# Patient Record
Sex: Female | Born: 1965 | Race: White | Hispanic: No | Marital: Married | Smoking: Never smoker
Health system: Southern US, Community
[De-identification: ages and names within clinical notes are randomized; demographics above are authoritative.]

## PROBLEM LIST (undated history)

## (undated) DIAGNOSIS — Z4509 Encounter for adjustment and management of other cardiac device: Secondary | ICD-10-CM

## (undated) DIAGNOSIS — Z8679 Personal history of other diseases of the circulatory system: Secondary | ICD-10-CM

## (undated) DIAGNOSIS — IMO0001 Reserved for inherently not codable concepts without codable children: Secondary | ICD-10-CM

## (undated) DIAGNOSIS — R002 Palpitations: Secondary | ICD-10-CM

## (undated) DIAGNOSIS — K269 Duodenal ulcer, unspecified as acute or chronic, without hemorrhage or perforation: Secondary | ICD-10-CM

## (undated) DIAGNOSIS — Z9889 Other specified postprocedural states: Secondary | ICD-10-CM

## (undated) DIAGNOSIS — R112 Nausea with vomiting, unspecified: Secondary | ICD-10-CM

## (undated) DIAGNOSIS — I2699 Other pulmonary embolism without acute cor pulmonale: Secondary | ICD-10-CM

## (undated) DIAGNOSIS — Z8489 Family history of other specified conditions: Secondary | ICD-10-CM

## (undated) DIAGNOSIS — I472 Ventricular tachycardia: Secondary | ICD-10-CM

## (undated) DIAGNOSIS — C73 Malignant neoplasm of thyroid gland: Secondary | ICD-10-CM

## (undated) DIAGNOSIS — I4729 Other ventricular tachycardia: Secondary | ICD-10-CM

## (undated) DIAGNOSIS — G459 Transient cerebral ischemic attack, unspecified: Secondary | ICD-10-CM

## (undated) DIAGNOSIS — K219 Gastro-esophageal reflux disease without esophagitis: Secondary | ICD-10-CM

## (undated) DIAGNOSIS — C801 Malignant (primary) neoplasm, unspecified: Secondary | ICD-10-CM

## (undated) DIAGNOSIS — T81718A Complication of other artery following a procedure, not elsewhere classified, initial encounter: Secondary | ICD-10-CM

## (undated) DIAGNOSIS — E039 Hypothyroidism, unspecified: Secondary | ICD-10-CM

## (undated) DIAGNOSIS — Z972 Presence of dental prosthetic device (complete) (partial): Secondary | ICD-10-CM

## (undated) DIAGNOSIS — Z973 Presence of spectacles and contact lenses: Secondary | ICD-10-CM

## (undated) DIAGNOSIS — M1990 Unspecified osteoarthritis, unspecified site: Secondary | ICD-10-CM

## (undated) DIAGNOSIS — R55 Syncope and collapse: Secondary | ICD-10-CM

## (undated) DIAGNOSIS — R51 Headache: Secondary | ICD-10-CM

## (undated) DIAGNOSIS — I4892 Unspecified atrial flutter: Secondary | ICD-10-CM

## (undated) DIAGNOSIS — T753XXA Motion sickness, initial encounter: Secondary | ICD-10-CM

## (undated) HISTORY — PX: LEFT OOPHORECTOMY: SHX1961

## (undated) HISTORY — PX: LAPAROSCOPY: SHX197

## (undated) HISTORY — DX: Other pulmonary embolism without acute cor pulmonale: T81.718A

## (undated) HISTORY — DX: Reserved for inherently not codable concepts without codable children: IMO0001

## (undated) HISTORY — DX: Encounter for adjustment and management of other cardiac device: Z45.09

## (undated) HISTORY — DX: Personal history of other diseases of the circulatory system: Z86.79

## (undated) HISTORY — DX: Unspecified atrial flutter: I48.92

## (undated) HISTORY — PX: OTHER SURGICAL HISTORY: SHX169

## (undated) HISTORY — DX: Syncope and collapse: R55

## (undated) HISTORY — PX: ABDOMINAL HYSTERECTOMY: SHX81

## (undated) HISTORY — PX: TONSILLECTOMY: SUR1361

## (undated) HISTORY — PX: DIAGNOSTIC LAPAROSCOPY: SUR761

## (undated) HISTORY — DX: Other ventricular tachycardia: I47.29

## (undated) HISTORY — PX: VULVECTOMY: SHX1086

## (undated) HISTORY — DX: Other pulmonary embolism without acute cor pulmonale: I26.99

## (undated) HISTORY — PX: CARDIAC ELECTROPHYSIOLOGY MAPPING AND ABLATION: SHX1292

## (undated) HISTORY — DX: Malignant (primary) neoplasm, unspecified: C80.1

## (undated) HISTORY — DX: Palpitations: R00.2

## (undated) HISTORY — DX: Malignant neoplasm of thyroid gland: C73

## (undated) HISTORY — DX: Ventricular tachycardia: I47.2

---
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
## 1993-01-10 DIAGNOSIS — I2699 Other pulmonary embolism without acute cor pulmonale: Secondary | ICD-10-CM

## 1993-01-10 HISTORY — DX: Other pulmonary embolism without acute cor pulmonale: I26.99

## 1998-07-13 ENCOUNTER — Inpatient Hospital Stay (HOSPITAL_COMMUNITY): Admission: EM | Admit: 1998-07-13 | Discharge: 1998-07-15 | Payer: Self-pay | Admitting: Emergency Medicine

## 1998-09-06 ENCOUNTER — Inpatient Hospital Stay (HOSPITAL_COMMUNITY): Admission: EM | Admit: 1998-09-06 | Discharge: 1998-09-07 | Payer: Self-pay | Admitting: Emergency Medicine

## 1998-09-08 ENCOUNTER — Ambulatory Visit (HOSPITAL_COMMUNITY): Admission: RE | Admit: 1998-09-08 | Discharge: 1998-09-08 | Payer: Self-pay | Admitting: Internal Medicine

## 1998-10-19 ENCOUNTER — Ambulatory Visit (HOSPITAL_COMMUNITY): Admission: RE | Admit: 1998-10-19 | Discharge: 1998-10-19 | Payer: Self-pay | Admitting: Internal Medicine

## 1998-11-15 ENCOUNTER — Inpatient Hospital Stay (HOSPITAL_COMMUNITY): Admission: EM | Admit: 1998-11-15 | Discharge: 1998-11-16 | Payer: Self-pay | Admitting: Emergency Medicine

## 1998-12-17 ENCOUNTER — Ambulatory Visit (HOSPITAL_COMMUNITY): Admission: RE | Admit: 1998-12-17 | Discharge: 1998-12-17 | Payer: Self-pay | Admitting: Internal Medicine

## 1998-12-21 ENCOUNTER — Ambulatory Visit (HOSPITAL_COMMUNITY): Admission: RE | Admit: 1998-12-21 | Discharge: 1998-12-21 | Payer: Self-pay | Admitting: Internal Medicine

## 2000-10-23 ENCOUNTER — Ambulatory Visit (HOSPITAL_COMMUNITY): Admission: RE | Admit: 2000-10-23 | Discharge: 2000-10-23 | Payer: Self-pay | Admitting: Internal Medicine

## 2003-01-11 DIAGNOSIS — I82409 Acute embolism and thrombosis of unspecified deep veins of unspecified lower extremity: Secondary | ICD-10-CM

## 2003-01-11 HISTORY — DX: Acute embolism and thrombosis of unspecified deep veins of unspecified lower extremity: I82.409

## 2005-03-01 ENCOUNTER — Ambulatory Visit: Payer: Self-pay | Admitting: Unknown Physician Specialty

## 2005-03-04 ENCOUNTER — Ambulatory Visit: Payer: Self-pay | Admitting: Unknown Physician Specialty

## 2006-01-31 ENCOUNTER — Ambulatory Visit: Payer: Self-pay | Admitting: Unknown Physician Specialty

## 2006-02-28 ENCOUNTER — Ambulatory Visit: Payer: Self-pay | Admitting: Internal Medicine

## 2006-05-30 ENCOUNTER — Ambulatory Visit: Payer: Self-pay | Admitting: Internal Medicine

## 2006-06-27 ENCOUNTER — Ambulatory Visit: Payer: Self-pay | Admitting: Internal Medicine

## 2006-08-10 ENCOUNTER — Ambulatory Visit: Payer: Self-pay | Admitting: Unknown Physician Specialty

## 2006-12-13 ENCOUNTER — Ambulatory Visit: Payer: Self-pay

## 2007-06-20 ENCOUNTER — Ambulatory Visit: Payer: Self-pay | Admitting: Unknown Physician Specialty

## 2007-06-21 ENCOUNTER — Ambulatory Visit: Payer: Self-pay | Admitting: Unknown Physician Specialty

## 2007-07-02 ENCOUNTER — Encounter: Payer: Self-pay | Admitting: Internal Medicine

## 2007-10-18 ENCOUNTER — Ambulatory Visit: Payer: Self-pay | Admitting: Internal Medicine

## 2008-06-25 ENCOUNTER — Ambulatory Visit: Payer: Self-pay | Admitting: Unknown Physician Specialty

## 2008-07-16 ENCOUNTER — Ambulatory Visit: Payer: Self-pay | Admitting: Unknown Physician Specialty

## 2008-07-23 ENCOUNTER — Ambulatory Visit: Payer: Self-pay | Admitting: Unknown Physician Specialty

## 2008-09-25 ENCOUNTER — Ambulatory Visit: Payer: Self-pay | Admitting: Internal Medicine

## 2008-12-20 ENCOUNTER — Observation Stay: Payer: Self-pay | Admitting: Internal Medicine

## 2009-01-29 ENCOUNTER — Ambulatory Visit: Payer: Self-pay | Admitting: Neurology

## 2009-02-16 ENCOUNTER — Encounter: Payer: Self-pay | Admitting: Internal Medicine

## 2009-02-17 ENCOUNTER — Encounter: Payer: Self-pay | Admitting: Internal Medicine

## 2009-02-26 ENCOUNTER — Ambulatory Visit: Payer: Self-pay | Admitting: Internal Medicine

## 2009-02-26 DIAGNOSIS — R002 Palpitations: Secondary | ICD-10-CM | POA: Insufficient documentation

## 2009-02-26 DIAGNOSIS — E7800 Pure hypercholesterolemia, unspecified: Secondary | ICD-10-CM | POA: Insufficient documentation

## 2009-02-26 DIAGNOSIS — Q078 Other specified congenital malformations of nervous system: Secondary | ICD-10-CM | POA: Insufficient documentation

## 2009-02-26 DIAGNOSIS — R55 Syncope and collapse: Secondary | ICD-10-CM | POA: Insufficient documentation

## 2009-03-02 ENCOUNTER — Encounter: Payer: Self-pay | Admitting: Internal Medicine

## 2009-07-08 ENCOUNTER — Ambulatory Visit: Payer: Self-pay | Admitting: Unknown Physician Specialty

## 2010-02-09 NOTE — Procedures (Signed)
Summary: Holter and Event  Holter and Event   Imported By: Harlon Flor 03/02/2009 12:58:18  _____________________________________________________________________  External Attachment:    Type:   Image     Comment:   External Document

## 2010-02-09 NOTE — Progress Notes (Signed)
Summary: [facility]   Imported By: Harlon Flor 03/02/2009 14:28:32  _____________________________________________________________________  External Attachment:    Type:   Image     Comment:   External Document

## 2010-02-09 NOTE — Medication Information (Signed)
Summary: Med List  Med List   Imported By: Harlon Flor 03/02/2009 13:06:17  _____________________________________________________________________  External Attachment:    Type:   Image     Comment:   External Document

## 2010-02-09 NOTE — Procedures (Signed)
Summary: Holter and Event  Holter and Event   Imported By: Harlon Flor 03/02/2009 12:57:50  _____________________________________________________________________  External Attachment:    Type:   Image     Comment:   External Document

## 2010-02-09 NOTE — Assessment & Plan Note (Signed)
Summary: rov   Visit Type:  Follow-up Primary Provider:  Bethann Punches, MD  CC:  fainted twice - felt it coming on, no sob, and no edema.  History of Present Illness: Mrs. Brandt is seen at the request of Dr. Hyacinth Meeker because of recurrent syncope.  She she has been seen by me remotely in the past.  syncope was apparently identified associated with nonsustained atrial arrhythmias which was finally clarified by the use of implantable loop recorder. It was elected to treat her with flecainide. There is some question for a while as to whether this was associated with lupus. 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  She has been on flecainide since. She saw Dr. Lysbeth Galas at [facility] a few years ago for the same issue. Sotalol was recommended but then the flecainide issue became clear and she has been maintained on flecainide.  For her arthritis she has been treated with Topamax as well asEnbrel  shots.  In January she had a syncopal episode. She had had a deep massage because of pain. She went to bed. About 2:00 she got up and went to the bathroom. She then was next found by her daughter a few minutes later on the kitchen floor with syncope. Ultimately she ended up at [facility] in the first recorded blood pressure was 104/50. Her evaluation was extensive including an ultrasound that was normal heart is Dopplers that were normal CT and MRI of her head that were normal and EEG which is pending. There were some memory issues associated with the above event and Topamax was discontinued. He also stopped the shots.  She then had another syncopal episode about a week or so ago. This also occurred in the context of getting up and using the bathroom. The symptoms of this episode were quite similar to her previously identified symptoms. I should note that she gets up uses the bathroom a couple of times each night.  She has minimal orthostatic intolerance, no shower  intolerance.  her diet is quite replete of salt as well as fluids   Current Problems (verified): 1)  Hypercholesterolemia Iia  (ICD-272.0) 2)  Palpitations  (ICD-785.1) 3)  Atrial Fibrillation  (ICD-427.31) 4)  Syncope and Collapse  (ICD-780.2)  Current Medications (verified): 1)  Effexor Xr 75 Mg Xr24h-Cap (Venlafaxine Hcl) .Marland Kitchen.. 1 By Mouth Once Daily 2)  Clonazepam 0.5 Mg Tabs (Clonazepam) .Marland Kitchen.. 1 By Mouth Once Daily As Needed 3)  Cavan-Folate Dha 65-1 & 250 Mg Misc (Prenatal-Fefum-L Methylfol-Dha) .Marland Kitchen.. 1 Poqd 4)  Ibuprofen 200 Mg Caps (Ibuprofen) .Marland Kitchen.. 1 Poqd As Needed 5)  Aspirin 81 Mg Tbec (Aspirin) .... Take One Tablet By Mouth Daily 6)  Valtrex 1 Gm Tabs (Valacyclovir Hcl) .Marland Kitchen.. 1 By Mouth Two Times A Day As Needed 7)  Methotrexate 2.5 Mg Tabs (Methotrexate Sodium) .Marland Kitchen.. 1 By Mouth Every Week 8)  Nadolol 40 Mg Tabs (Nadolol) .Marland Kitchen.. 1 By Mouth Once Daily 9)  Flecainide Acetate 50 Mg Tabs (Flecainide Acetate) .... Take One Tablet By Mouth Every 12 Hours 10)  Fish Oil 1000 Mg Caps (Omega-3 Fatty Acids) .Marland Kitchen.. 1 By Mouth Once Daily 11)  Enbrel Sureclick 50 Mg/ml Soln (Etanercept) .... Hold 12)  Vitamins C E 500-400 Mg-Unit Caps (Vitamins C E) .Marland Kitchen.. 1 By Mouth Once Daily 13)  Vitamin D3 1000 Unit Caps (Cholecalciferol) .Marland Kitchen.. 1 By Mouth Weekly 14)  Caltrate 600 1500 Mg Tabs (Calcium Carbonate) .Marland Kitchen.. 1 By Mouth Two  Times A Day  Allergies (verified): 1)  ! Vancomycin 2)  ! Codeine 3)  ! Demerol 4)  ! Augmentin 5)  ! Erythromycin  Past History:  Past Medical History: Last updated: 02/26/2009 Atrial Flutter Syncope hx of hypotension hx of palpitations loop recorder white coat hypertension hx of post pulmonary embolus asthma nonsustained ventricular tachycardia  Past Surgical History: Last updated: 02/26/2009 loop recorder implanted 2000, removed 2002 hysterectomy partial vulvectomy diagnositic laparoscopy tonsillectomy laparoscopy lap  Family History: Last updated:  02/26/2009 Father CABG  Vital Signs:  Patient profile:   45 year old female Height:      64 inches Weight:      160.50 pounds BMI:     27.65 Pulse rate:   59 / minute Pulse rhythm:   regular BP sitting:   110 / 76  (left arm) Cuff size:   regular  Vitals Entered By: Mercer Pod (February 26, 2009 1:19 PM)  Physical Exam  General:  Alert and orientedYoung Caucasian female appearing her stated age in no acute distress. HEENT  normal . Neck veins were flat; carotids brisk and full without bruits. No lymphadenopathy. Back without kyphosis. Lungs clear. Heart sounds regular without murmurs or gallops. PMI nondisplaced. Abdomen soft with active bowel sounds without midline pulsation or hepatomegaly. Femoral pulses and distal pulses intact. Extremities were without clubbing cyanosis or edemaSkin warm and dry. 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 In the second event there were some lightheadedness just prior to getting up off the commode. I wonder whether this might not have been a harbinger of the fall to come. I have advised her that when he is in school last days nocturnal startled and going to the bathroom sitting on the commode for a few minutes prior to getting up might help allow equal abrasion of the dysautonomia.  There is no evidence of recurrent atrial arrhythmia. It is unlikely that this scenario is such that arrhythmia is would occur in the same context and no other times. In the event that she has recurrent syncope in a different context perhaps greater pursuit of an  arrhythmic trigger would be appropriate  Problem # 2:  SYNCOPE AND COLLAPSE (ICD-780.2) See above Her updated medication list for this problem includes:    Aspirin 81 Mg Tbec (Aspirin) .Marland Kitchen... Take one tablet by mouth daily    Nadolol 40 Mg Tabs (Nadolol) .Marland Kitchen... 1 by mouth once daily    Flecainide Acetate 50 Mg Tabs (Flecainide acetate) .Marland Kitchen... Take one tablet by mouth every 12 hours  Problem # 3:  ATRIAL TACHYCARDIA-NONSUSTAINED (ICD-427.0) aas above Her updated medication list for this problem includes:    Aspirin 81 Mg Tbec (Aspirin) .Marland Kitchen... Take one tablet by mouth daily    Nadolol 40 Mg Tabs (Nadolol) .Marland Kitchen... 1 by mouth once daily    Flecainide Acetate 50 Mg Tabs (Flecainide acetate) .Marland Kitchen... Take one tablet by mouth every 12 hours

## 2010-05-25 NOTE — Letter (Signed)
June 27, 2006    G. Saverio Danker, MD  [address]  [place], [place] [address]   RE:  LESBIA, OTTAWAY  MRN:  [id number]  /  DOB:  01/06/1966   Dear Koren Bound:   Ms. Claus comes back and she did not tolerate the Rythmol.  I did a  literature search on Flecainide and drug lupus.  I did not see it.  Flecainide is a class 1B and it may not have the same type of lupus that  the class 1A's did.   In any case, she put herself back on the Flecainide and she noted that  the previous prescription of Flecainide was a different formulation by  tablet size and color.  Because of that, I have taken the liberty of  writing her a prescription for Flecainide 100 mg, to take as Tambocor,  DAW.  I hope that she will tolerate this better and more consistently.   PHYSICAL EXAMINATION:  VITAL SIGNS:  Blood pressure 120/80, pulse 68.  LUNGS:  Clear.  HEART:  Sounds regular.  EXTREMITIES:  Without edema.   FOLLOWUP:  I will plan to see her again in six months' time.   Koren Bound, I hope this letter finds you well.    Sincerely,      Duke Salvia, MD, [facility]  Electronically Signed    SCK/MedQ  DD: 06/27/2006  DT: 06/28/2006  Job #: [id number]

## 2010-05-25 NOTE — Letter (Signed)
May 30, 2006    Danella Penton, MD.  [address]  [place], [place] [address]   RE:  Priscilla Greene, Priscilla Greene  MRN:  [id number]  /  DOB:  Oct 06, 1965   Dear Priscilla Greene:   Thanks very much for asking [place] to see Priscilla Greene.  As you know, she  has a history of neurally-mediated syncope and in the past has been at  least on one occasion triggered by nonsustained atrial arrhythmia.  She  also has nonsustained ventricular arrhythmias.  She had been treated  with Flecainide for some time with recent up titration from 50 to 100  mg.  She got a new formulation today and when she took it she became  _________ and was really very sleepy.  She got to work, had a couple of  cups of coffee.  She then had the urge to defecate, which has been  associated with syncope in the past.  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  She was described as being extremely pale, she was quite  hypotensive, we do not know what her heart rate was.  She then gradually  got better with persistent orthostatic intolerance for some time,  persistent pallor.  She now feels much better.   She had tried to down titrate her Nadolol a few months ago.  She had  noted that with 40 going to 20 that she had increasing palpitations so  she went back up to 40 and had been tolerating that relatively well.   She had seen Lavenia Atlas in the interim, and there had been some  concerned raised about whether she may have drug-induced lupus that she  recalls is attributable to Flecainide.  To confess this admission to  you, I am not familiar with this and I will have to do a literature  search on it.  However, it is easy at this point to change the drug, and  we will plan to put her on Propafenone with Rythmol DAW per formulation  so the main consistency of the formulation of 325 mg daily.   On examination  today, her blood pressure was 152/89, her pulse was 86,  we did not bother doing orthostatic's.  Her lungs were clear, heart  sounds were regular, and the extremities were without edema.   Electrocardiogram was normal.   Priscilla Greene, we will plan to see her again in about a month to assess how she  is doing.   I have asked her to increase her salt intake in the interim as well.   Thanks again.    Sincerely,      Duke Salvia, MD, [facility]  Electronically Signed    SCK/MedQ  DD: 05/30/2006  DT: 05/30/2006  Job #: [id number]   CC:    Milton Ferguson, M.D.

## 2010-05-28 NOTE — Discharge Summary (Signed)
Priscilla Greene. [facility]  Patient:    Priscilla Greene                         MRN: [id number] Adm. Date:  [id number] Disc. Date: 11/16/98 Attending:  Nathen May Dictator:   Lavella Hammock, P.A. CC:         Nathen May, M.D. [facility]             Waldon Reining, M.D., [facility], [place], [place].                           Discharge Summary  PROCEDURES:  None.  HOSPITAL COURSE:  Priscilla Greene is a 45 year old female with a history of nonsustained V-tach and multiple syncopal spells who was seen by Dr. Graciela Husbands on November 09, 1998, and no medication changes.  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  She had a spell of extreme weakness while out shopping and did not have any palpitations with this.  Then on the day of discharge she awoke with tachy palpitations and chest tightness with nausea and mild diaphoresis for approximately 30 minutes.  She went to the emergency room but was pain free at the time of exam. At the time of exam she was in normal sinus rhythm.  She was admitted for further evaluation.  Her enzymes were negative for myocardial infarction and her EKG did not show any ischemic changes.  The next day she felt tired but stated this was  normal after these spells and stated that she was having no other symptoms and o further episodes of presyncope.  She was seen by Dr. Graciela Husbands in the hospital and er medications were adjusted by increasing her nadolol to 40 mg a day to 20 mg a day. She was advised to keep her appointment at [facility] on December 01, 1998, and to follow up with Dr. Graciela Husbands as well.  She was considered  stable for discharge on November 16, 1998.  LABORATORY VALUES:  Sodium 134, potassium 4.1, chloride 106, CO2 27, BUN 17, creatinine 0.7, glucose 106, SGOT 39.  Hemoglobin 14.0, hematocrit 39.8, WBC 4.8, platelets 288.  Serial CK-MB negative for myocardial infarction.  EKG:  Sinus  bradycardia rate 59 beats per minute with a QT of 0.429.  DISCHARGE CONDITION:  Stable.  CONSULTATIONS:  None.  COMPLICATIONS:  None.  DISCHARGE DIAGNOSES: 1. Presyncope with tachy palpitations.  Follow up with Dr. Graciela Husbands at [facility]. 2. History of nonsustained ventricular tachycardia. 3. Status post hysterectomy secondary to endometriosis. 4. History of peptic ulcer disease. 5. Status post pulmonary embolus. 6. History of asthma. 7. History of allergies to IV dye, CODEINE and DEMEROL.  DISCHARGE INSTRUCTIONS:  ACTIVITY:  Her activity level was to be as tolerated.  DIET:  She is to stick to a low fat diet.  FOLLOWUP:  She is to keep her [facility] appointment.  She is to follow up  with Dr. Joanie Coddington as needed.  She is to follow up with Dr. Graciela Husbands and the office will call.  DISCHARGE MEDICATIONS: 1. Nadolol 40 mg q.a.m. 2. Paxil 20 mg q.d. 3. Tambocor 50 mg b.i.d. 4. Flovent and atropine as taken at home. 5. Flonase as taken at home. 6. Verapamil 240 mg q.h.s. 7. Coated aspirin 81 mg q.d. 8. Prilosec p.r.n. DD:  11/16/98 TD:  11/16/98 Job: [id number]

## 2010-05-28 NOTE — Cardiovascular Report (Signed)
[place]. [facility]  Patient:    Priscilla Greene, Priscilla Greene Visit Number: [id number] MRN: [id number]          Service Type: CAT Location: [facility] 2854 [id number] Attending Physician:  Nathen May Dictated by:   Nathen May, M.D., [facility] Proc. Date: 10/23/00 Admit Date:  10/23/2000   CC:         Electrophysiology Laboratory  Cleda Clarks, M.D., [facility]   Cardiac Catheterization  PREOPERATIVE DIAGNOSIS:  Syncope with palpations.  POSTOPERATIVE DIAGNOSIS:  Nonsustained atrial tachycardia and nonsustained ventricular tachycardia; with prior loop implantation.  PROCEDURE:  Explantation of a previously implanted loop recorder.  DESCRIPTION OF PROCEDURE:  Following the obtainment of informed consent, the patient was brought to the electrophysiology laboratory and placed on the fluoroscopic table in supine position.  She was given intravenous ciprofloxacin because of a prior allergy to penicillin and vancomycin.  She developed whelps and itching of her upper extremity with this; Cipro was discontinued.  She was given 50 mg IV Solu-Medrol and 50 mg of Benadryl.  After routine prepping and draping of the left upper chest, the line of the previous incision was opened.  It was parallel to the pectoral fold.  The incision was carried down to the layer of the device pocket with sharp dissection, and the pocket was opened.  The device was explanted.  Hemostasis was obtained in the pocket.  The pocket was closed and irrigated with antibiotic-containing saline solution.  The anterior and posterior walls of the pocket were closed with 2-0 suture.  The wound was then closed in three layers in the normal fashion.  The wound was washed, dried and a Benzoin Steri-Strip dressing was then applied.  Sponge, needle and instrument counts were correct at the end of the procedure (according to the staff). Dictated by:   Nathen May, M.D., [facility] Attending  Physician:  Nathen May DD:  10/23/00 TD:  10/23/00 Job: [id number]

## 2010-05-28 NOTE — Op Note (Signed)
[place]. [facility]  Patient:    Priscilla Greene                         MRN: [id number] Proc. Date: 12/21/98 Adm. Date:  [id number] Attending:  Nathen May CC:         Dr. Cleda Clarks, ________Clinic             Electrophysiology Laboratory             Delsa Grana, R.N., [facility]                           Operative Report  PREOPERATIVE DIAGNOSIS:  Syncope with ventricular tachycardia, nonsustained documented.  POSTOPERATIVE DIAGNOSIS:  Syncope with ventricular tachycardia, nonsustained documented.  PROCEDURE:  Implantable loop recorder insertion.  COMPLICATIONS:  None apparent.  SURGEON:  Nathen May, M.D.  DESCRIPTION OF PROCEDURE:  Following the obtainment of informed consent, the patient was brought to the electrophysiology laboratory.  After routine prep and drape of the upper chest, lidocaine was infiltrated in the prepectoral groove. An incision was made and carried down to the layer of the prepectoral fascia using  electrocautery.  A pocket was formed using electrocautery. Hemostasis was obtained.   Thereafter, the Reveal+ [id number] implantable loop was inserted.  Serial number [id number].  It was secured to the prepectoral fascia.  The wound was closed in  three layers in the normal fashion.  The wound was washed, dried and a benzoin,  Steri-Strip dressing was then applied.  Needle count, sponge count and instrument counts were correct at the end of the procedure according to the nurses. DD:  12/21/98 TD:  12/21/98 Job: [id number]

## 2010-05-28 NOTE — Letter (Signed)
February 28, 2006    Bethann Punches, M.D.  [address]  [place], [place] [address]   RE:  Priscilla, Greene  MRN:  [id number]  /  DOB:  1965-12-26   Dear Priscilla Greene:   It was a pleasure to see Priscilla Greene after a hiatus of about 5 years.  As you recall, she has a history of atrial arrhythmias that were very  symptomatic.  I had seen her, Ronn Melena had seen her, and then  somebody else at [facility] had also seen her and we had elected ultimately to  treat her with medications including nadolol and flecainide.  Somewhere  along the line since I saw her last, her flecainide was up-titrated from  50 mg to 100 mg twice daily and this in combination with her nadolol has  rendered her largely symptom free.  This is true at least from her  palpitation point of view.   She denies chest pain or shortness of breath.   She does have significant fatigue.  She has down-titrated her nadolol on  her own over the last couple months to down from 40 to 20 with some  improvement in her fatigue.  Her sleep hygiene, though, is quite  disrupted.  She sleeps about 3 hours a day, going to bed at 8 p.m.,  getting up 11:30 p.m. or 12 a.m., and then spending the rest of the  night awake.  She has daytime somnolence although now working she is  going full steam all day long.   She has had no recurrent syncope.   She has had a history of ventricular ectopy and nonsustained ventricular  tachycardia which is also quiescent.   Her past medical history is largely as noted previously.  She did have  an implantable loop recorder that has been explanted.  Her hemoglobin  A1c recently was 6.9 but she has worked at getting it down to 5.4.  She  has lost 25 pounds.  Her blood pressure is quiescent.  Her HDL is low in  the mid 20s.  LDL is not known.   Her medications include:  1. Prilosec 20.  2. Tambocor 100 b.i.d.  3. Effexor 75.  4. Ranitidine 75 b.i.d.  5. P.r.n. Klonopin.  6. Nadolol 20.   She is allergic  to CODEINE, DEMEROL, VANCOMYCIN, AMOXICILLIN, CIPRO, and  AUGMENTIN.   Social history:  She is married.  She has two children.  The older is  graduating from high school.  She does not use cigarettes, alcohol or  recreational drugs.   Her family history is very strongly positive for coronary artery  disease, the specifics of which I do not remember.   On examination, she is a middle-aged Caucasian female, appearing her  stated age of 64.  Her blood pressure is 121/76, her pulse is 68, her  weight I did not record.  Her HEENT exam demonstrated no icterus or  xanthomata.  The neck veins were flat.  The carotids were brisk and full  bilaterally without bruits.  Back was without kyphosis or scoliosis.  Lungs were clear.  Heart sounds were regular without murmurs or gallops.  The abdomen was soft.  Extremities were without peripheral edema.  Neurologic exam was grossly normal.   Electrocardiogram was not obtained.   IMPRESSION:  1. Atrial tachycardia.  2. Nonsustained ventricular tachycardia.  3. Flecainide and nadolol therapy for the above.  4. Metabolic syndrome notable for:      a.  Low HDL.      b.     Abdominal obesity.      c.     Hyperglycemia.  5. Panic attacks.   Priscilla Greene, Priscilla Greene is doing very well from an arrhythmia point of view.  I share your concern about the flecainide.  In particular in the context  of her cardiac risk factors as suggested by the metabolic syndrome and  her powerful family history, I think monitoring her for the development  of occult coronary disease becomes important to ensure the safety of the  drug.   Today I have spoken to your nurse and she will arrange for a stress  echocardiogram.  In the event that these windows are inadequate, Myoview  testing would be appropriate to consider.   I would be glad to see her on an annual basis if you think that would  help.   I look forward to talking to you about her.    Sincerely,       Duke Salvia, MD, [facility]  Electronically Signed    SCK/MedQ  DD: 02/28/2006  DT: 02/28/2006  Job #: [id number]

## 2010-06-28 ENCOUNTER — Encounter: Payer: Self-pay | Admitting: Internal Medicine

## 2010-07-28 ENCOUNTER — Ambulatory Visit: Payer: Self-pay | Admitting: Obstetrics and Gynecology

## 2011-08-08 ENCOUNTER — Other Ambulatory Visit: Payer: Self-pay | Admitting: Internal Medicine

## 2011-08-09 ENCOUNTER — Ambulatory Visit: Payer: Self-pay | Admitting: Internal Medicine

## 2011-08-24 ENCOUNTER — Ambulatory Visit: Payer: Self-pay | Admitting: Internal Medicine

## 2012-05-16 ENCOUNTER — Emergency Department: Payer: Self-pay | Admitting: Internal Medicine

## 2012-05-16 LAB — COMPREHENSIVE METABOLIC PANEL
Albumin: 3.7 g/dL (ref 3.4–5.0)
Alkaline Phosphatase: 60 U/L (ref 50–136)
Anion Gap: 8 (ref 7–16)
BUN: 22 mg/dL — ABNORMAL HIGH (ref 7–18)
Bilirubin,Total: 0.9 mg/dL (ref 0.2–1.0)
Calcium, Total: 8.9 mg/dL (ref 8.5–10.1)
Creatinine: 0.48 mg/dL — ABNORMAL LOW (ref 0.60–1.30)
EGFR (African American): >60
EGFR (Non-African Amer.): >60
Osmolality: 279 (ref 275–301)
SGPT (ALT): 30 U/L (ref 12–78)
Sodium: 138 mmol/L (ref 136–145)
Total Protein: 6.9 g/dL (ref 6.4–8.2)

## 2012-05-16 LAB — TSH: Thyroid Stimulating Horm: 1.77 u[IU]/mL

## 2012-05-16 LAB — CK TOTAL AND CKMB (NOT AT ARMC)
CK, Total: 73 U/L (ref 21–215)
CK-MB: <0.5 ng/mL — ABNORMAL LOW (ref 0.5–3.6)

## 2012-08-28 ENCOUNTER — Ambulatory Visit: Payer: Self-pay | Admitting: Internal Medicine

## 2012-09-03 ENCOUNTER — Encounter (INDEPENDENT_AMBULATORY_CARE_PROVIDER_SITE_OTHER): Payer: Self-pay | Admitting: Surgery

## 2012-09-03 ENCOUNTER — Ambulatory Visit (INDEPENDENT_AMBULATORY_CARE_PROVIDER_SITE_OTHER): Payer: BC Managed Care – PPO | Admitting: Surgery

## 2012-09-03 VITALS — BP 114/68 | HR 64 | Temp 98.1°F | Resp 14 | Ht 64.0 in | Wt 153.4 lb

## 2012-09-03 DIAGNOSIS — D440 Neoplasm of uncertain behavior of thyroid gland: Secondary | ICD-10-CM

## 2012-09-03 DIAGNOSIS — D449 Neoplasm of uncertain behavior of unspecified endocrine gland: Secondary | ICD-10-CM

## 2012-09-03 NOTE — Progress Notes (Signed)
General Surgery [facility]  Chief Complaint  Priscilla Greene presents with  . New Evaluation    thyroid nodule with atypia - referral from 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 Priscilla Greene had had a near syncopal episode during a medical procedure while at work. She underwent a CT scan which showed an incidental finding of a calcified thyroid nodule. Subsequent thyroid ultrasound in July 2014 showed 3 small nodules in the right thyroid lobe. The left lobe showed no evidence of abnormality. The dominant nodule on the right measured 1.43 cm and contained calcifications. Subsequent fine-needle aspiration biopsy showed a follicular lesion with mild cytologic atypia including nuclear grooves and limited colloid content. Priscilla Greene also was noted to have bilateral mildly enlarged level III lymph nodes. She is now referred at this time for surgical evaluation for possible resection for definitive diagnosis.  Priscilla Greene has a history of thyroiditis and 2004. She has had no prior head or neck surgery. The only history of thyroid cancer it is questionable and a maternal aunt. There is no family history of other endocrine neoplasm. Priscilla Greene has never been on thyroid hormone. Recent TSH level is normal at 1.04.  Past Medical History  Diagnosis Date  . Atrial flutter   . Syncope   . History of hypotension   . Palpitations     Hx of  . Encounter for interrogation of cardiac recorder     Loop recorder  . White coat hypertension   . Postoperative pulmonary embolism     Hx of  . Asthma   . Nonsustained ventricular tachycardia     Current Outpatient Prescriptions  Medication Sig Dispense Refill  . acetaminophen (TYLENOL) 500 MG tablet Take 500 mg by mouth every 6 (six) hours as needed for pain.      Marland Kitchen aspirin 81 MG EC  tablet Take 81 mg by mouth daily.        . Calcium Carbonate (CALTRATE 600) 1500 MG TABS Take 1 tablet by mouth 2 (two) times daily.        . cetirizine (ZYRTEC) 10 MG tablet Take 10 mg by mouth daily.      . flecainide (TAMBOCOR) 50 MG tablet Take 50 mg by mouth every 12 (twelve) hours.        Marland Kitchen ibuprofen (ADVIL,MOTRIN) 200 MG tablet Take 200 mg by mouth daily.        . nadolol (CORGARD) 40 MG tablet Take 40 mg by mouth daily.        . valACYclovir (VALTREX) 1000 MG tablet Take 1,000 mg by mouth 2 (two) times daily as needed.        . Vitamins C E 500-400 MG-UNIT CAPS Take 1 capsule by mouth daily.        No current facility-administered medications for this visit.    Allergies  Allergen Reactions  . Amoxicillin-Pot Clavulanate   . Codeine   . Erythromycin   . Meperidine Hcl   . Topamax [Topiramate] Other (See Comments)    Syncope with memory loss  . Vancomycin     Family History  Problem Relation Age of Onset  . Heart disease Father   . Diabetes Mother     History   Social History  . Marital Status: Married    Spouse Name: N/A    Number of  Children: N/A  . Years of Education: N/A   Social History Main Topics  . Smoking status: Never Smoker   . Smokeless tobacco: Never Used  . Alcohol Use: No  . Drug Use: No  . Sexual Activity: None   Other Topics Concern  . None   Social History Narrative  . None    REVIEW OF SYSTEMS - PERTINENT POSITIVES ONLY: Denies tremor. Denies palpitations. Occasional mild dysphagia. Occasional discomfort right midneck. Otherwise no compressive symptoms.  EXAM: Filed Vitals:   09/03/12 1316  BP: 114/68  Pulse: 64  Temp: 98.1 F (36.7 C)  Resp: 14    HEENT: normocephalic; pupils equal and reactive; sclerae clear; dentition good; mucous membranes moist NECK:  Mild discomfort on palpation of right thyroid lobe; right lobe is slightly nodular without discrete or dominant mass; no palpable lymphadenopathy; symmetric on extension; no  palpable anterior or posterior cervical lymphadenopathy; no supraclavicular masses; no tenderness CHEST: clear to auscultation bilaterally without rales, rhonchi, or wheezes CARDIAC: regular rate and rhythm without significant murmur; peripheral pulses are full EXT:  non-tender without edema; no deformity NEURO: no gross focal deficits; no sign of tremor   LABORATORY RESULTS: See [facility] (CHL-Epic) for most recent results  RADIOLOGY RESULTS: See [facility] (CHL-Epic) for most recent results  IMPRESSION: Right thyroid nodule, 1.43 cm, with cytologic atypia  PLAN: Priscilla Greene and I discussed the above studies and findings at length. I have recommended right thyroid lobectomy for definitive diagnosis. We will also staple a few regional lymph nodes for evaluation. If the final pathology is benign, then I believe she will require no further intervention. Certainly if there is evidence of malignancy, she would require completion thyroidectomy.  Priscilla Greene and I discussed the procedure at length. We discussed other surgical options. We discussed the risk of the procedure including risk to recurrent laryngeal nerve and parathyroid glands. We discussed the hospital stay to be anticipated. We discussed postoperative recovery and return to activity. She understands and wishes to proceed.  The risks and benefits of the procedure have been discussed at length with the Priscilla Greene.  The Priscilla Greene understands the proposed procedure, potential alternative treatments, and the course of recovery to be expected.  All of the Priscilla Greene's questions have been answered at this time.  The Priscilla Greene wishes to proceed with surgery.  [name], MD, FACS General & Endocrine Surgery [facility]  Primary Care Physician: Danella Penton., MD

## 2012-09-03 NOTE — Patient Instructions (Signed)

## 2012-09-17 ENCOUNTER — Encounter (INDEPENDENT_AMBULATORY_CARE_PROVIDER_SITE_OTHER): Payer: Self-pay

## 2012-10-04 ENCOUNTER — Encounter (HOSPITAL_COMMUNITY): Payer: Self-pay | Admitting: Pharmacy Technician

## 2012-10-10 ENCOUNTER — Ambulatory Visit (HOSPITAL_COMMUNITY)
Admission: RE | Admit: 2012-10-10 | Discharge: 2012-10-10 | Disposition: A | Discharge status: Home / Self Care | Payer: BC Managed Care – PPO | Source: Ambulatory Visit | Attending: Surgery | Admitting: Surgery

## 2012-10-10 ENCOUNTER — Encounter (HOSPITAL_COMMUNITY): Payer: Self-pay

## 2012-10-10 ENCOUNTER — Encounter (HOSPITAL_COMMUNITY)
Admission: RE | Admit: 2012-10-10 | Discharge: 2012-10-10 | Disposition: A | Discharge status: Home / Self Care | Payer: BC Managed Care – PPO | Source: Ambulatory Visit | Attending: Surgery | Admitting: Surgery

## 2012-10-10 DIAGNOSIS — Z01818 Encounter for other preprocedural examination: Secondary | ICD-10-CM | POA: Insufficient documentation

## 2012-10-10 DIAGNOSIS — Z01812 Encounter for preprocedural laboratory examination: Secondary | ICD-10-CM | POA: Insufficient documentation

## 2012-10-10 HISTORY — DX: Other specified postprocedural states: Z98.890

## 2012-10-10 HISTORY — DX: Headache: R51

## 2012-10-10 HISTORY — DX: Gastro-esophageal reflux disease without esophagitis: K21.9

## 2012-10-10 HISTORY — DX: Unspecified osteoarthritis, unspecified site: M19.90

## 2012-10-10 HISTORY — DX: Duodenal ulcer, unspecified as acute or chronic, without hemorrhage or perforation: K26.9

## 2012-10-10 HISTORY — DX: Other specified postprocedural states: R11.2

## 2012-10-10 LAB — BASIC METABOLIC PANEL
BUN: 19 mg/dL (ref 6–23)
CO2: 27 mEq/L (ref 19–32)
Calcium: 9.6 mg/dL (ref 8.4–10.5)
GFR calc non Af Amer: >90 mL/min (ref >90)
Glucose, Bld: 93 mg/dL (ref 70–99)

## 2012-10-10 LAB — CBC
HCT: 45.5 % (ref 36.0–46.0)
Hemoglobin: 15.6 g/dL — ABNORMAL HIGH (ref 12.0–15.0)
MCH: 30.7 pg (ref 26.0–34.0)
MCHC: 34.3 g/dL (ref 30.0–36.0)
MCV: 89.6 fL (ref 78.0–100.0)
Platelets: 251 10*3/uL (ref 150–400)
RBC: 5.08 MIL/uL (ref 3.87–5.11)

## 2012-10-10 NOTE — Progress Notes (Signed)
EKG from [facility] from 05/16/2012 on chart.

## 2012-10-10 NOTE — Patient Instructions (Addendum)
20      Your procedure is scheduled on:  Thursday 10/18/2012  Report to [facility] at  0530 AM.  Call this number if you have problems the night before or morning of surgery:  [phone]   Remember:             IF YOU USE CPAP,BRING MASK AND TUBING AM OF SURGERY!   Do not eat food or drink liquids AFTER MIDNIGHT!  Take these medicines the morning of surgery with A SIP OF WATER: Flecaninide, Nadolol   Do not bring valuables to the hospital. [facility] IS NOT RESPONSIBLE FOR ANY BELONGINGS OR VALUABLES BROUGHT TO HOSPITAL.  Marland Kitchen  Leave suitcase in the car. After surgery it may be brought to your room.  For patients admitted to the hospital, checkout time is 11:00 AM the day of              Discharge.    DO NOT WEAR JEWELRY , MAKE-UP, LOTIONS,POWDERS,PERFUMES!             WOMEN -DO NOT SHAVE LEGS OR UNDERARMS 12 HRS. BEFORE SURGERY!               MEN MAY SHAVE AS USUAL!             CONTACTS,DENTURES OR BRIDGEWORK, FALSE EYELASHES MAY NOT BE WORN INTO SURGERY!                                           Patients discharged the day of surgery will not be allowed to drive home. If going home the same day of surgery, must have someone stay with you first 24 hrs.at home and arrange for someone to drive you home from the Hospital.                        YOUR DRIVER IS:   Special Instructions:             Please read over the following fact sheets that you were given:             1.  PREPARING FOR SURGERY SHEET              2.INCENTIVE SPIROMETRY                                        [place].Priscilla Annas,RN,BSN     [phone]                FAILURE TO FOLLOW THESE INSTRUCTIONS MAY RESULT IN  CANCELLATION OF YOUR SURGERY!               Patient Signature:___________________________

## 2012-10-11 ENCOUNTER — Telehealth (INDEPENDENT_AMBULATORY_CARE_PROVIDER_SITE_OTHER): Payer: Self-pay | Admitting: *Deleted

## 2012-10-11 NOTE — Progress Notes (Signed)
Quick Note:  These results are acceptable for scheduled surgery.  Vicci Reder M. Baily Serpe, MD, FACS [facility] Office: [phone]   ______ 

## 2012-10-11 NOTE — Telephone Encounter (Signed)
Patient called to update Dr. Gerrit Friends that she has been diagnosed with a severe UTI.  Patient started on Septra DS last night per Dr. Derrell Lolling however patient states she has had an allergic reaction to this medication so is going to her PMD today.  She states her PMD has advised her he will be able to give her a shot of Rocephin this afternoon.  Patient just wanted Dr. Gerrit Friends to be aware and is hoping this will not interfere with her upcoming surgery.  Patient also asking if she can take Azo and OTC muscle relaxant for urinary spasms.  Patient just wants to make sure this will not interfere with anything as well.  Explained to patient that a message will be sent to Dr. Gerrit Friends to make him aware and get his opinion and as soon as I hear back from I will update her.  Patient states understanding and agreeable at this time.

## 2012-10-15 ENCOUNTER — Telehealth (INDEPENDENT_AMBULATORY_CARE_PROVIDER_SITE_OTHER): Payer: Self-pay | Admitting: *Deleted

## 2012-10-15 NOTE — Telephone Encounter (Signed)
Message copied by Consuelo Pandy on Mon Oct 15, 2012 10:58 AM ------      Message from: [name]      Created: Mon Oct 15, 2012  9:56 AM       Haig Prophet sent to me:            Patient called to update Dr. Gerrit Friends that she has been diagnosed with a severe UTI.  Patient started on Septra DS last night per Dr. Derrell Lolling however patient states she has had an allergic reaction to this medication so is going to her PMD today.  She states her PMD has advised her he will be able to give her a shot of Rocephin this afternoon.  Patient just wanted Dr. Gerrit Friends to be aware and is hoping this will not interfere with her upcoming surgery.  Patient also asking if she can take Azo and OTC muscle relaxant for urinary spasms.  Patient just wants to make sure this will not interfere with anything as well.  Explained to patient that a message will be sent to Dr. Gerrit Friends to make him aware and get his opinion and as soon as I hear back from I will update her.  Patient states understanding and agreeable at this time.                 I see no problem with any of these meds around the time of this patient's thyroid surgery.            Thanks,            tmg            [name], MD, [facility]      Office: [phone]             ------

## 2012-10-15 NOTE — Telephone Encounter (Signed)
Pt called in stating that her PCP has prescribed her Diflucan 100mg  and nystatin topical cream.  Pt wanted [place] updated on her meds.

## 2012-10-15 NOTE — Telephone Encounter (Signed)
Patient states she is seeing DR. Miller today for bladder spasm.Will keep [place] posted

## 2012-10-15 NOTE — Telephone Encounter (Signed)
This is the response to a previous telephone message on 10/12/12.  LM for patient to call back to update her on Dr. Sid Falcon below message.

## 2012-10-18 ENCOUNTER — Encounter (HOSPITAL_COMMUNITY): Payer: Self-pay | Admitting: *Deleted

## 2012-10-18 ENCOUNTER — Observation Stay (HOSPITAL_COMMUNITY)
Admission: RE | Admit: 2012-10-18 | Discharge: 2012-10-19 | Disposition: A | Discharge status: Home / Self Care | Payer: BC Managed Care – PPO | Source: Ambulatory Visit | Attending: Surgery | Admitting: Surgery

## 2012-10-18 ENCOUNTER — Encounter (HOSPITAL_COMMUNITY): Payer: BC Managed Care – PPO | Admitting: Anesthesiology

## 2012-10-18 ENCOUNTER — Encounter (HOSPITAL_COMMUNITY)
Admission: RE | Disposition: A | Discharge status: Home / Self Care | Payer: Self-pay | Source: Ambulatory Visit | Attending: Surgery

## 2012-10-18 ENCOUNTER — Ambulatory Visit (HOSPITAL_COMMUNITY): Payer: BC Managed Care – PPO | Admitting: Anesthesiology

## 2012-10-18 DIAGNOSIS — M069 Rheumatoid arthritis, unspecified: Secondary | ICD-10-CM | POA: Insufficient documentation

## 2012-10-18 DIAGNOSIS — Z79899 Other long term (current) drug therapy: Secondary | ICD-10-CM | POA: Insufficient documentation

## 2012-10-18 DIAGNOSIS — C73 Malignant neoplasm of thyroid gland: Secondary | ICD-10-CM

## 2012-10-18 DIAGNOSIS — C770 Secondary and unspecified malignant neoplasm of lymph nodes of head, face and neck: Secondary | ICD-10-CM | POA: Insufficient documentation

## 2012-10-18 DIAGNOSIS — Z86711 Personal history of pulmonary embolism: Secondary | ICD-10-CM | POA: Insufficient documentation

## 2012-10-18 DIAGNOSIS — D440 Neoplasm of uncertain behavior of thyroid gland: Secondary | ICD-10-CM

## 2012-10-18 DIAGNOSIS — K219 Gastro-esophageal reflux disease without esophagitis: Secondary | ICD-10-CM | POA: Insufficient documentation

## 2012-10-18 HISTORY — DX: Malignant neoplasm of thyroid gland: C73

## 2012-10-18 HISTORY — PX: THYROID LOBECTOMY: SHX420

## 2012-10-18 SURGERY — LOBECTOMY, THYROID
Anesthesia: General | Site: Neck | Laterality: Bilateral | Wound class: Clean

## 2012-10-18 MED ORDER — HYDROMORPHONE HCL PF 1 MG/ML IJ SOLN: 0.2500 mg | INTRAMUSCULAR | Status: DC | PRN

## 2012-10-18 MED ORDER — GLYCOPYRROLATE 0.2 MG/ML IJ SOLN
INTRAMUSCULAR | Status: DC | PRN
  Administered 2012-10-18: 0.6 mg via INTRAVENOUS

## 2012-10-18 MED ORDER — NEOSTIGMINE METHYLSULFATE 1 MG/ML IJ SOLN
INTRAMUSCULAR | Status: DC | PRN
  Administered 2012-10-18: 5 mg via INTRAVENOUS

## 2012-10-18 MED ORDER — CIPROFLOXACIN IN D5W 400 MG/200ML IV SOLN
400.0000 mg | INTRAVENOUS | Status: AC
  Administered 2012-10-18: 400 mg via INTRAVENOUS

## 2012-10-18 MED ORDER — PROMETHAZINE HCL 25 MG/ML IJ SOLN
12.5000 mg | INTRAMUSCULAR | Status: DC | PRN
Start: 2012-10-18 — End: 2012-10-19
  Filled 2012-10-18: qty 1

## 2012-10-18 MED ORDER — SCOPOLAMINE 1 MG/3DAYS TD PT72
MEDICATED_PATCH | TRANSDERMAL | Status: AC
  Filled 2012-10-18: qty 1

## 2012-10-18 MED ORDER — NITROFURANTOIN MONOHYD MACRO 100 MG PO CAPS
100.0000 mg | ORAL_CAPSULE | Freq: Two times a day (BID) | ORAL | Status: DC
  Administered 2012-10-18 – 2012-10-19 (×3): 100 mg via ORAL
  Filled 2012-10-18 (×4): qty 1

## 2012-10-18 MED ORDER — ROCURONIUM BROMIDE 100 MG/10ML IV SOLN
INTRAVENOUS | Status: DC | PRN
  Administered 2012-10-18: 10 mg via INTRAVENOUS
  Administered 2012-10-18: 40 mg via INTRAVENOUS

## 2012-10-18 MED ORDER — FENTANYL CITRATE 0.05 MG/ML IJ SOLN
INTRAMUSCULAR | Status: DC | PRN
  Administered 2012-10-18: 25 ug via INTRAVENOUS

## 2012-10-18 MED ORDER — KCL IN DEXTROSE-NACL 20-5-0.45 MEQ/L-%-% IV SOLN
INTRAVENOUS | Status: DC
  Administered 2012-10-18 – 2012-10-19 (×2): via INTRAVENOUS
  Filled 2012-10-18 (×3): qty 1000

## 2012-10-18 MED ORDER — METOCLOPRAMIDE HCL 5 MG/ML IJ SOLN
INTRAMUSCULAR | Status: DC | PRN
  Administered 2012-10-18: 10 mg via INTRAVENOUS

## 2012-10-18 MED ORDER — NADOLOL 40 MG PO TABS
40.0000 mg | ORAL_TABLET | Freq: Every morning | ORAL | Status: DC
  Administered 2012-10-19: 40 mg via ORAL
  Filled 2012-10-18: qty 1

## 2012-10-18 MED ORDER — DEXAMETHASONE SODIUM PHOSPHATE 4 MG/ML IJ SOLN
INTRAMUSCULAR | Status: DC | PRN
  Administered 2012-10-18: 10 mg via INTRAVENOUS

## 2012-10-18 MED ORDER — SCOPOLAMINE 1 MG/3DAYS TD PT72
MEDICATED_PATCH | TRANSDERMAL | Status: DC | PRN
  Administered 2012-10-18: 1 via TRANSDERMAL

## 2012-10-18 MED ORDER — LACTATED RINGERS IV SOLN: INTRAVENOUS | Status: DC

## 2012-10-18 MED ORDER — LACTATED RINGERS IV SOLN
INTRAVENOUS | Status: DC | PRN
  Administered 2012-10-18 (×2): via INTRAVENOUS

## 2012-10-18 MED ORDER — HYDROCODONE-ACETAMINOPHEN 5-325 MG PO TABS: 1.0000 | ORAL_TABLET | ORAL | Status: DC | PRN

## 2012-10-18 MED ORDER — MIDAZOLAM HCL 5 MG/5ML IJ SOLN
INTRAMUSCULAR | Status: DC | PRN
  Administered 2012-10-18: 2 mg via INTRAVENOUS

## 2012-10-18 MED ORDER — FLECAINIDE ACETATE 50 MG PO TABS
50.0000 mg | ORAL_TABLET | Freq: Two times a day (BID) | ORAL | Status: DC
  Administered 2012-10-18 – 2012-10-19 (×2): 50 mg via ORAL
  Filled 2012-10-18 (×3): qty 1

## 2012-10-18 MED ORDER — FAMOTIDINE IN NACL 20-0.9 MG/50ML-% IV SOLN
20.0000 mg | Freq: Once | INTRAVENOUS | Status: AC
  Administered 2012-10-18: 20 mg via INTRAVENOUS
  Filled 2012-10-18: qty 50

## 2012-10-18 MED ORDER — PHENYLEPHRINE HCL 10 MG/ML IJ SOLN
INTRAMUSCULAR | Status: DC | PRN
  Administered 2012-10-18: 80 ug via INTRAVENOUS

## 2012-10-18 MED ORDER — ACETAMINOPHEN 325 MG PO TABS
650.0000 mg | ORAL_TABLET | ORAL | Status: DC | PRN
  Administered 2012-10-18: 650 mg via ORAL
  Filled 2012-10-18: qty 2

## 2012-10-18 MED ORDER — HYDROMORPHONE HCL PF 1 MG/ML IJ SOLN
INTRAMUSCULAR | Status: DC | PRN
  Administered 2012-10-18 (×2): 1 mg via INTRAVENOUS

## 2012-10-18 MED ORDER — CIPROFLOXACIN IN D5W 400 MG/200ML IV SOLN
INTRAVENOUS | Status: AC
  Filled 2012-10-18: qty 200

## 2012-10-18 MED ORDER — CALCIUM CARBONATE 1250 (500 CA) MG PO TABS
2.0000 | ORAL_TABLET | Freq: Three times a day (TID) | ORAL | Status: DC
  Administered 2012-10-18 – 2012-10-19 (×2): 1000 mg via ORAL
  Filled 2012-10-18 (×5): qty 2

## 2012-10-18 MED ORDER — KETOROLAC TROMETHAMINE 15 MG/ML IJ SOLN
15.0000 mg | Freq: Four times a day (QID) | INTRAMUSCULAR | Status: DC | PRN
  Administered 2012-10-18 – 2012-10-19 (×4): 15 mg via INTRAVENOUS
  Filled 2012-10-18 (×4): qty 1

## 2012-10-18 MED ORDER — CEFAZOLIN SODIUM-DEXTROSE 2-3 GM-% IV SOLR
INTRAVENOUS | Status: AC
  Filled 2012-10-18: qty 50

## 2012-10-18 MED ORDER — BIOTENE DRY MOUTH MT LIQD
15.0000 mL | Freq: Two times a day (BID) | OROMUCOSAL | Status: DC
  Administered 2012-10-18 – 2012-10-19 (×2): 15 mL via OROMUCOSAL

## 2012-10-18 MED ORDER — EPHEDRINE SULFATE 50 MG/ML IJ SOLN
INTRAMUSCULAR | Status: DC | PRN
  Administered 2012-10-18: 10 mg via INTRAVENOUS
  Administered 2012-10-18: 5 mg via INTRAVENOUS
  Administered 2012-10-18: 10 mg via INTRAVENOUS
  Administered 2012-10-18: 5 mg via INTRAVENOUS

## 2012-10-18 MED ORDER — PROPOFOL 10 MG/ML IV BOLUS
INTRAVENOUS | Status: DC | PRN
  Administered 2012-10-18: 150 mg via INTRAVENOUS

## 2012-10-18 MED ORDER — HYDROMORPHONE HCL PF 1 MG/ML IJ SOLN: 1.0000 mg | INTRAMUSCULAR | Status: DC | PRN

## 2012-10-18 SURGICAL SUPPLY — 49 items
APL SKNCLS STERI-STRIP NONHPOA (GAUZE/BANDAGES/DRESSINGS)
APPLICATOR COTTON TIP 6IN STRL (MISCELLANEOUS) ×1 IMPLANT
ATTRACTOMAT 16X20 MAGNETIC DRP (DRAPES) ×3 IMPLANT
BENZOIN TINCTURE PRP APPL 2/3 (GAUZE/BANDAGES/DRESSINGS) ×1 IMPLANT
BLADE HEX COATED 2.75 (ELECTRODE) ×3 IMPLANT
BLADE SURG 15 STRL LF DISP TIS (BLADE) ×2 IMPLANT
BLADE SURG 15 STRL SS (BLADE) ×3
CANISTER SUCTION 2500CC (MISCELLANEOUS) ×3 IMPLANT
CHLORAPREP W/TINT 10.5 ML (MISCELLANEOUS) ×3 IMPLANT
CLIP TI MEDIUM 6 (CLIP) ×8 IMPLANT
CLIP TI WIDE RED SMALL 6 (CLIP) ×10 IMPLANT
CLOTH BEACON ORANGE TIMEOUT ST (SAFETY) ×1 IMPLANT
DECANTER SPIKE VIAL GLASS SM (MISCELLANEOUS) ×1 IMPLANT
DISSECTOR ROUND CHERRY 3/8 STR (MISCELLANEOUS) IMPLANT
DRAPE LAPAROTOMY TRNSV 102X78 (DRAPE) ×1 IMPLANT
DRAPE PED LAPAROTOMY (DRAPES) ×3 IMPLANT
DRESSING SURGICEL FIBRLLR 1X2 (HEMOSTASIS) ×2 IMPLANT
DRSG SURGICEL FIBRILLAR 1X2 (HEMOSTASIS) ×3
ELECT REM PT RETURN 9FT ADLT (ELECTROSURGICAL) ×3
ELECTRODE REM PT RTRN 9FT ADLT (ELECTROSURGICAL) ×2 IMPLANT
GAUZE SPONGE 4X4 16PLY XRAY LF (GAUZE/BANDAGES/DRESSINGS) ×3 IMPLANT
GLOVE BIOGEL PI IND STRL 7.0 (GLOVE) ×2 IMPLANT
GLOVE BIOGEL PI INDICATOR 7.0 (GLOVE) ×1
GLOVE SURG ORTHO 8.0 STRL STRW (GLOVE) ×3 IMPLANT
GOWN PREVENTION PLUS LG XLONG (DISPOSABLE) ×3 IMPLANT
GOWN STRL REIN XL XLG (GOWN DISPOSABLE) ×6 IMPLANT
KIT BASIN OR (CUSTOM PROCEDURE TRAY) ×3 IMPLANT
NDL HYPO 25X1 1.5 SAFETY (NEEDLE) ×1 IMPLANT
NEEDLE HYPO 25X1 1.5 SAFETY (NEEDLE) ×3 IMPLANT
NS IRRIG 1000ML POUR BTL (IV SOLUTION) ×3 IMPLANT
PACK BASIC VI WITH GOWN DISP (CUSTOM PROCEDURE TRAY) ×3 IMPLANT
PENCIL BUTTON HOLSTER BLD 10FT (ELECTRODE) ×3 IMPLANT
SHEARS HARMONIC 9CM CVD (BLADE) ×3 IMPLANT
SPONGE GAUZE 4X4 12PLY (GAUZE/BANDAGES/DRESSINGS) ×3 IMPLANT
SPONGE LAP 4X18 X RAY DECT (DISPOSABLE) ×1 IMPLANT
STAPLER VISISTAT 35W (STAPLE) ×1 IMPLANT
STRIP CLOSURE SKIN 1/2X4 (GAUZE/BANDAGES/DRESSINGS) ×3 IMPLANT
SUT MNCRL AB 4-0 PS2 18 (SUTURE) ×3 IMPLANT
SUT SILK 2 0 (SUTURE) ×3
SUT SILK 2 0 SH (SUTURE) IMPLANT
SUT SILK 2-0 18XBRD TIE 12 (SUTURE) ×2 IMPLANT
SUT SILK 3 0 (SUTURE)
SUT SILK 3-0 18XBRD TIE 12 (SUTURE) IMPLANT
SUT VIC AB 3-0 SH 18 (SUTURE) ×3 IMPLANT
SUT VIC AB 4-0 PS2 27 (SUTURE) ×3 IMPLANT
SYR BULB IRRIGATION 50ML (SYRINGE) ×3 IMPLANT
SYR CONTROL 10ML LL (SYRINGE) ×3 IMPLANT
TOWEL OR 17X26 10 PK STRL BLUE (TOWEL DISPOSABLE) ×9 IMPLANT
YANKAUER SUCT BULB TIP 10FT TU (MISCELLANEOUS) ×3 IMPLANT

## 2012-10-18 NOTE — Progress Notes (Signed)
Patient stated she had a urinary tract infection since pre surgical testing visit. Stated she was placed on antibiotics and Dr. Gerrit Friends is aware.

## 2012-10-18 NOTE — Transfer of Care (Signed)
Immediate Anesthesia Transfer of Care Note  Patient: Priscilla Greene  Procedure(s) Performed: Procedure(s): total thyroidectomy with limited central compartment lympy node disection (Bilateral)  Patient Location: PACU  Anesthesia Type:General  Level of Consciousness: Patient easily awoken, sedated, comfortable, cooperative, following commands, responds to stimulation.   Airway & Oxygen Therapy: Patient spontaneously breathing, ventilating well, oxygen via simple oxygen mask.  Post-op Assessment: Report given to PACU RN, vital signs reviewed and stable, moving all extremities.   Post vital signs: Reviewed and stable.  Complications: No apparent anesthesia complications

## 2012-10-18 NOTE — Anesthesia Preprocedure Evaluation (Addendum)
Anesthesia Evaluation  Patient identified by MRN, date of birth, ID band Patient awake    Reviewed: Allergy & Precautions, H&P , NPO status , Patient's Chart, lab work & pertinent test results, reviewed documented beta blocker date and time   History of Anesthesia Complications (+) PONV  Airway Mallampati: II TM Distance: >3 FB Neck ROM: full    Dental  (+) Missing and Dental Advisory Given Missing left upper lateral incissor:   Pulmonary asthma ,  Post op PE breath sounds clear to auscultation  Pulmonary exam normal       Cardiovascular Exercise Tolerance: Good hypertension, Pt. on home beta blockers + dysrhythmias Ventricular Tachycardia Rhythm:regular Rate:Normal  Palpitations.  History non-sustained VT with unsuccessful ablation.   Neuro/Psych negative neurological ROS  negative psych ROS   GI/Hepatic negative GI ROS, Neg liver ROS,   Endo/Other  negative endocrine ROS  Renal/GU negative Renal ROS  negative genitourinary   Musculoskeletal   Abdominal   Peds  Hematology negative hematology ROS (+)   Anesthesia Other Findings History of serotonin syndrome 4 years ago with effexor.  Reproductive/Obstetrics negative OB ROS                          Anesthesia Physical Anesthesia Plan  ASA: III  Anesthesia Plan: General   Post-op Pain Management:    Induction: Intravenous  Airway Management Planned: Oral ETT  Additional Equipment:   Intra-op Plan:   Post-operative Plan: Extubation in OR  Informed Consent: I have reviewed the patients History and Physical, chart, labs and discussed the procedure including the risks, benefits and alternatives for the proposed anesthesia with the patient or authorized representative who has indicated his/her understanding and acceptance.   Dental Advisory Given  Plan Discussed with: CRNA and Surgeon  Anesthesia Plan Comments:          Anesthesia Quick Evaluation

## 2012-10-18 NOTE — Brief Op Note (Signed)
10/18/2012  9:46 AM  PATIENT:  Priscilla Greene  47 y.o. 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   PROCEDURE:  Procedure(s): total thyroidectomy with limited central compartment lympy node disection (Bilateral)  SURGEON:  Surgeon(s) and Role:    * [name], MD - Primary    * Romie Levee, MD  ANESTHESIA:   general  EBL:  Total I/O In: 1000 [I.V.:1000] Out: -   BLOOD ADMINISTERED:none  DRAINS: none   LOCAL MEDICATIONS USED:  NONE  SPECIMEN:  Excision  DISPOSITION OF SPECIMEN:  PATHOLOGY  COUNTS:  YES  TOURNIQUET:  * No tourniquets in log *  DICTATION: .Other Dictation: Dictation Number [id number]  PLAN OF CARE: Admit for overnight observation  PATIENT DISPOSITION:  PACU - hemodynamically stable.   Delay start of Pharmacological VTE agent (>24hrs) due to surgical blood loss or risk of bleeding: yes  [name], MD, FACS General & Endocrine Surgery [facility] Office: [phone]

## 2012-10-18 NOTE — H&P (Signed)
Priscilla Greene is an 47 y.o. female.    General Surgery [facility]  Chief Complaint:   Right thyroid nodule with atypia  HPI:  thyroid nodule with atypia - 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 Patient had had a near syncopal episode during a medical procedure while at work. She underwent a CT scan which showed an incidental finding of a calcified thyroid nodule. Subsequent thyroid ultrasound in July 2014 showed 3 small nodules in the right thyroid lobe. The left lobe showed no evidence of abnormality. The dominant nodule on the right measured 1.43 cm and contained calcifications. Subsequent fine-needle aspiration biopsy showed a follicular lesion with mild cytologic atypia including nuclear grooves and limited colloid content. Patient also was noted to have bilateral mildly enlarged level III lymph nodes. She is now referred at this time for surgical evaluation for possible resection for definitive diagnosis.  Patient has a history of thyroiditis and 2004. She has had no prior head or neck surgery. The only history of thyroid cancer it is questionable and a maternal aunt. There is no family history of other endocrine neoplasm. Patient has never been on thyroid hormone. Recent TSH level is normal at 1.04.  Past Medical History  Diagnosis Date  . Atrial flutter   . Syncope   . History of hypotension   . Palpitations     Hx of  . Encounter for interrogation of cardiac recorder     Loop recorder  . White coat hypertension   . Postoperative pulmonary embolism     Hx of  . Asthma   . Nonsustained ventricular tachycardia   . PONV (postoperative nausea and vomiting)   . Headache(784.0)     migraines  . GERD (gastroesophageal reflux disease)   . Duodenal ulcer 1986  . Arthritis    rheumatoid    Past Surgical History  Procedure Laterality Date  . Loop recorder      Implanted 2000, removed 2002  . Abdominal hysterectomy    . Vulvectomy      Partial  . Diagnostic laparoscopy    . Tonsillectomy    . Laparoscopy    . Cardiac electrophysiology mapping and ablation    . Left oophorectomy      Family History  Problem Relation Age of Onset  . Heart disease Father   . Diabetes Mother    Social History:  reports that she has never smoked. She has never used smokeless tobacco. She reports that she does not drink alcohol or use illicit drugs.  Allergies:  Allergies  Allergen Reactions  . Contrast Media [Iodinated Diagnostic Agents] Shortness Of Breath and Rash  . Amoxicillin-Pot Clavulanate     Diarrhea, vomiting  . Codeine     palpiataions  . Erythromycin     Vomiting,severe  . Meperidine Hcl     palpitations  . Septra Ds [Sulfamethoxazole-Tmp Ds]   . Topamax [Topiramate] Other (See Comments)    Syncope with memory loss  . Vancomycin     Medications Prior to Admission  Medication Sig Dispense Refill  . acetaminophen (TYLENOL) 500 MG tablet Take 500 mg by mouth every 6 (six) hours as needed for pain.      . Calcium Carbonate-Vit D-Min (CALTRATE PLUS PO) Take 1 tablet by mouth daily.      Marland Kitchen  flecainide (TAMBOCOR) 50 MG tablet Take 50 mg by mouth every 12 (twelve) hours.        . fluconazole (DIFLUCAN) 150 MG tablet Take 150 mg by mouth once.      Marland Kitchen ibuprofen (ADVIL,MOTRIN) 200 MG tablet Take 200 mg by mouth every 6 (six) hours as needed for pain.       . nadolol (CORGARD) 40 MG tablet Take 40 mg by mouth every morning.       . nitrofurantoin, macrocrystal-monohydrate, (MACROBID) 100 MG capsule Take 100 mg by mouth 2 (two) times daily.      Marland Kitchen aspirin 81 MG EC tablet Take 81 mg by mouth daily.        . cetirizine (ZYRTEC) 10 MG tablet Take 10 mg by mouth daily.      . valACYclovir (VALTREX) 1000 MG tablet Take 1,000 mg by mouth 2 (two) times daily as needed  (coldsore outbreaks).       . vitamin E (VITAMIN E) 400 UNIT capsule Take 400 Units by mouth daily.        No results found for this or any previous visit (from the past 48 hour(s)). No results found.  Review of Systems  Constitutional: Negative.   HENT: Negative.   Eyes: Negative.   Respiratory: Negative.   Cardiovascular: Negative.   Skin: Negative.   All other systems reviewed and are negative.    Blood pressure 123/73, pulse 56, temperature 97.9 F (36.6 C), temperature source Oral, resp. rate 16, SpO2 100.00%. Physical Exam  Constitutional: She is oriented to person, place, and time. She appears well-developed and well-nourished. No distress.  HENT:  Head: Normocephalic and atraumatic.  Right Ear: External ear normal.  Left Ear: External ear normal.  Eyes: Conjunctivae are normal. Pupils are equal, round, and reactive to light. No scleral icterus.  Neck: Normal range of motion. Neck supple. No tracheal deviation present. No thyromegaly present.  Cardiovascular: Normal rate, regular rhythm and normal heart sounds.   No murmur heard. Respiratory: Effort normal and breath sounds normal. She has no wheezes.  GI: Soft. Bowel sounds are normal. She exhibits no distension.  Musculoskeletal: Normal range of motion. She exhibits no edema.  Lymphadenopathy:    She has no cervical adenopathy.  Neurological: She is alert and oriented to person, place, and time.  Skin: Skin is warm and dry.  Psychiatric: She has a normal mood and affect. Her behavior is normal.     Assessment/Plan Right thyroid nodule with atypia  Plan right thyroid lobectomy  The risks and benefits of the procedure have been discussed at length with the patient.  The patient understands the proposed procedure, potential alternative treatments, and the course of recovery to be expected.  All of the patient's questions have been answered at this time.  The patient wishes to proceed with surgery.  [name],  MD, FACS General & Endocrine Surgery [facility] Office: [phone]   [name] M 10/18/2012, 7:30 AM

## 2012-10-18 NOTE — Anesthesia Postprocedure Evaluation (Signed)
  Anesthesia Post-op Note  Patient: Priscilla Greene  Procedure(s) Performed: Procedure(s) (LRB): total thyroidectomy with limited central compartment lympy node disection (Bilateral)  Patient Location: PACU  Anesthesia Type: General  Level of Consciousness: awake and alert   Airway and Oxygen Therapy: Patient Spontanous Breathing  Post-op Pain: mild  Post-op Assessment: Post-op Vital signs reviewed, Patient's Cardiovascular Status Stable, Respiratory Function Stable, Patent Airway and No signs of Nausea or vomiting  Last Vitals:  Filed Vitals:   10/18/12 1045  BP: 144/75  Pulse: 50  Temp: 36.4 C  Resp: 12    Post-op Vital Signs: stable   Complications: No apparent anesthesia complications

## 2012-10-18 NOTE — Preoperative (Signed)
Beta Blockers   Reason not to administer Beta Blockers:Not Applicable, took BB this am 

## 2012-10-19 ENCOUNTER — Encounter (HOSPITAL_COMMUNITY): Payer: Self-pay | Admitting: Surgery

## 2012-10-19 LAB — BASIC METABOLIC PANEL
BUN: 7 mg/dL (ref 6–23)
CO2: 25 mEq/L (ref 19–32)
Calcium: 9.4 mg/dL (ref 8.4–10.5)
Chloride: 103 mEq/L (ref 96–112)
Creatinine, Ser: 0.64 mg/dL (ref 0.50–1.10)
GFR calc Af Amer: >90 mL/min (ref >90)
GFR calc non Af Amer: >90 mL/min (ref >90)
Glucose, Bld: 135 mg/dL — ABNORMAL HIGH (ref 70–99)
Potassium: 3.5 mEq/L (ref 3.5–5.1)
Sodium: 139 mEq/L (ref 135–145)

## 2012-10-19 MED ORDER — HYDROCODONE-ACETAMINOPHEN 5-325 MG PO TABS: 1.0000 | ORAL_TABLET | ORAL | Status: DC | PRN

## 2012-10-19 MED ORDER — SYNTHROID 100 MCG PO TABS: 100.0000 ug | ORAL_TABLET | Freq: Every day | ORAL | Status: DC

## 2012-10-19 NOTE — Discharge Summary (Signed)
Physician Discharge Summary [facility]  Patient ID: Priscilla Greene MRN: [id number] DOB/AGE: November 06, 1965 47 y.o.  Admit date: 10/18/2012 Discharge date: 10/19/2012  Admission Diagnoses:   Thyroid neoplasm of uncertain behavior  Discharge Diagnoses:  Principal Problem:   Neoplasm of uncertain behavior of thyroid gland   Discharged Condition: good  Hospital Course: patient admitted for observation after total thyroidectomy.  Post op course uncomplicated with stable calcium level of 9.4 on the morning after surgery.  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  Total thyroidectomy performed.  Consults: None  Significant Diagnostic Studies: labs: calcium  Treatments: surgery: total thyroidectomy with limited lymph node dissection  Discharge Exam: Blood pressure 117/74, pulse 56, temperature 98.3 F (36.8 C), temperature source Oral, resp. rate 16, height 5\' 4"  (1.626 m), weight 144 lb (65.318 kg), SpO2 100.00%. HEENT - clear Neck - wound clear and dry, mild STS, voice soft but normal Chest - clear bilaterally Cor - RRR  Disposition: Home with family  Discharge Orders   Future Appointments Provider Department Dept Phone   11/02/2012 2:00 PM [name], MD [facility], [place] [phone]   Future Orders Complete By Expires   Diet - low sodium heart healthy  As directed    Discharge instructions  As directed    Comments:     THYROID & PARATHYROID SURGERY - POST OP INSTRUCTIONS  Always review your discharge instruction sheet from the facility where your surgery was performed.  A prescription for pain medication may be given to you upon discharge.  Take your pain medication as prescribed.  If narcotic pain medicine is not needed, then you may take acetaminophen (Tylenol) or ibuprofen (Advil) as needed.  Take your usually prescribed medications unless otherwise directed.  If you need a refill on your pain  medication, please contact your pharmacy. They will contact our office to request authorization.  Prescriptions will not be processed after 5 pm or on weekends.  Start with a light diet upon arrival home, such as soup and crackers or toast.  Be sure to drink plenty of fluids daily.  Resume your normal diet the day after surgery.  Most patients will experience some swelling and bruising on the chest and neck area.  Ice packs will help.  Swelling and bruising can take several days to resolve.   It is common to experience some constipation if taking pain medication after surgery.  Increasing fluid intake and taking a stool softener will usually help or prevent this problem.  A mild laxative (Milk of Magnesia or Miralax) should be taken according to package directions if there are no bowel movements after 48 hours.  You may remove your bandages 24-48 hours after surgery, and you may shower at that time.  You have steri-strips (small skin tapes) in place directly over the incision.  These strips should be left on the skin for 7-10 days and then removed.  You may resume regular (light) daily activities beginning the next day-such as daily self-care, walking, climbing stairs-gradually increasing activities as tolerated.  You may have sexual intercourse when it is comfortable.  Refrain from any heavy lifting or straining until approved by your doctor.  You may drive when you no longer are taking prescription pain medication, you can comfortably wear a seatbelt, and you can safely maneuver your car and apply brakes.  You should see your doctor in the office for a follow-up appointment approximately two to three weeks after your  surgery.  Make sure that you call for this appointment within a day or two after you arrive home to insure a convenient appointment time.  WHEN TO CALL YOUR DOCTOR: -- Fever greater than 101.5 -- Inability to urinate -- Nausea and/or vomiting - persistent -- Extreme swelling or  bruising -- Continued bleeding from incision -- Increased pain, redness, or drainage from the incision -- Difficulty swallowing or breathing -- Muscle cramping or spasms -- Numbness or tingling in hands or around lips  The clinic staff is available to answer your questions during regular business hours.  Please don't hesitate to call and ask to speak to one of the nurses if you have concerns.  [name], MD, FACS General & Endocrine Surgery [facility] Office: [phone]   Increase activity slowly  As directed    Remove dressing in 24 hours  As directed        Medication List         acetaminophen 500 MG tablet  Commonly known as:  TYLENOL  Take 500 mg by mouth every 6 (six) hours as needed for pain.     aspirin 81 MG EC tablet  Take 81 mg by mouth daily.     CALTRATE PLUS PO  Take 1 tablet by mouth daily.     cetirizine 10 MG tablet  Commonly known as:  ZYRTEC  Take 10 mg by mouth daily.     flecainide 50 MG tablet  Commonly known as:  TAMBOCOR  Take 50 mg by mouth every 12 (twelve) hours.     fluconazole 150 MG tablet  Commonly known as:  DIFLUCAN  Take 150 mg by mouth once.     HYDROcodone-acetaminophen 5-325 MG per tablet  Commonly known as:  NORCO/VICODIN  Take 1-2 tablets by mouth every 4 (four) hours as needed for pain.     ibuprofen 200 MG tablet  Commonly known as:  ADVIL,MOTRIN  Take 200 mg by mouth every 6 (six) hours as needed for pain.     nadolol 40 MG tablet  Commonly known as:  CORGARD  Take 40 mg by mouth every morning.     nitrofurantoin (macrocrystal-monohydrate) 100 MG capsule  Commonly known as:  MACROBID  Take 100 mg by mouth 2 (two) times daily.     valACYclovir 1000 MG tablet  Commonly known as:  VALTREX  Take 1,000 mg by mouth 2 (two) times daily as needed (coldsore outbreaks).     vitamin E 400 UNIT capsule  Generic drug:  vitamin E  Take 400 Units by mouth daily.         [name], MD,  [facility] Office: [phone]   Signed: [name] 10/19/2012, 9:49 AM

## 2012-10-19 NOTE — Op Note (Signed)
NAME:  Priscilla Greene, Priscilla Greene NO.:  [id number]  MEDICAL RECORD NO.:  [id number]  LOCATION:  1536                         FACILITY:  [facility]  PHYSICIAN:  [name], MD      DATE OF BIRTH:  11/04/1965  DATE OF PROCEDURE:  10/18/2012                               OPERATIVE REPORT   PREOPERATIVE DIAGNOSIS:  Right thyroid nodule with cytologic atypia.  POSTOPERATIVE DIAGNOSIS:  Papillary thyroid carcinoma with regional lymph node metastasis.  PROCEDURE:  Total thyroidectomy with limited central compartment lymph node dissection.  SURGEON:  [name], MD, FACS  ASSISTANT:  Romie Levee, MD  ANESTHESIA:  General per Dr. Leta Jungling.  ESTIMATED BLOOD LOSS:  Minimal.  PREPARATION:  ChloraPrep.  COMPLICATIONS:  None.  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  The patient had undergone a CT scan of the neck and incidental finding of a calcified thyroid nodule was noted. The dominant nodule in the right lobe measured 1.4 cm.  Fine-needle aspiration showed a follicular lesion with cytologic atypia including nuclear grooves.  There were mildly enlarged lymph nodes.  The patient was referred for evaluation and now comes to Surgery for resection for definitive diagnosis.  BODY OF REPORT:  Procedures done in OR #6 at the [facility]  The patient was brought to the operating room, placed in supine position on the operating room table.  Following administration of general anesthesia, the patient was positioned and then prepped and draped in the usual aseptic fashion.  After ascertaining that an adequate level of anesthesia had been obtained, a Kocher incision was made with a #15 blade.  Dissection was carried through subcutaneous tissues and platysma.  Hemostasis was obtained with electrocautery. Skin flaps were elevated cephalad and  caudad from the thyroid notch to the sternal notch.  A Mahorner self-retaining retractor was placed for exposure.  Strap muscles were incised in the midline.  Dissection was begun on the left side.  Strap muscles were reflected laterally exposing a normal left thyroid lobe.  There was no palpable abnormality.  Next, we turned our attention to the right side of the neck.  Again, strap muscles were reflected laterally.  Right thyroid lobe was slightly larger and contains a dominant mass in the mid to lower portion of the lobe measuring at least 1.5 cm in size.  This mass was quite firm and likely calcified.  Strap muscles were reflected laterally.  Then, using blunt dissection, the right thyroid lobe was completely exposed.  Superior pole vessels were dissected out and divided individually between small and medium Ligaclips with the Harmonic scalpel.  Parathyroid tissue was identified and preserved.  Inferior venous tributaries were divided between Ligaclips with the Harmonic scalpel.  Gland was rolled anteriorly. Branches of the inferior thyroid artery were dissected out and divided individually between small Ligaclips with the Harmonic scalpel.  Gland was rolled further anteriorly.  Ligament of Allyson Sabal was released with the electrocautery.  Gland was mobilized up and onto the anterior trachea. Isthmus was mobilized across the midline.  There was no substantial pyramidal  lobe.  Isthmus was transected at its junction with the left thyroid lobe with the Harmonic scalpel.  The specimen was marked with a suture at the right superior pole, and submitted for permanent evaluation.  Exploration of the neck reveals a palpable enlarged lymph node in the central compartment.  This was gently mobilized.  It was dissected out and hemostasis maintained with electrocautery.  Lymph node measures less than 1 cm in size but was quite firm.  It was submitted to Pathology for frozen section.  Dr. [place] Rund  confirms metastatic papillary thyroid carcinoma in this lymph node.  At this point, a decision was made to proceed with total thyroidectomy. Therefore, the left thyroid lobe was again exposed.  With gentle blunt dissection, the entire lobe was mobilized.  Superior pole vessels were divided between small and medium Ligaclips with the Harmonic scalpel. Superior parathyroid gland was identified and preserved.  Inferior venous tributaries were divided between Ligaclips with the Harmonic scalpel.  Gland was rolled anteriorly and branches of the inferior thyroid artery were divided between small Ligaclips with the Harmonic scalpel.  Ligament of Allyson Sabal was released with the electrocautery and the left lobe was mobilized onto the anterior trachea from which it was completely excised with the Harmonic scalpel.  It was submitted separately to Pathology for review.  Next, the central compartment, zone 6, lymph nodes were dissected out using the electrocautery for hemostasis.  Care was taken to avoid the parathyroid glands and recurrent nerves.  This tissue was submitted separately labeled as central compartment lymph nodes, zone 6.  This will be for permanent evaluation only.  The neck was irrigated with warm saline.  Good hemostasis was achieved throughout.  Fibrillar was placed throughout the operative field.  Strap muscles were reapproximated in the midline with interrupted 3-0 Vicryl sutures.  Platysma was closed with interrupted 3-0 Vicryl sutures.  Skin was closed with a running 4-0 Monocryl subcuticular suture.  Wound was washed and dried and benzoin and Steri- Strips were applied.  Sterile dressings were applied.  The patient was awakened from anesthesia and brought to the recovery room.  The patient tolerated the procedure well.   [name], MD, [facility] Office: [phone]    TMG/MEDQ  D:  10/18/2012  T:  10/19/2012  Job:  [id number]  cc:   Wendall Mola, DO [facility]  Bethann Punches, MD Fax: [phone]

## 2012-10-22 ENCOUNTER — Other Ambulatory Visit (INDEPENDENT_AMBULATORY_CARE_PROVIDER_SITE_OTHER): Payer: Self-pay

## 2012-10-22 ENCOUNTER — Ambulatory Visit (INDEPENDENT_AMBULATORY_CARE_PROVIDER_SITE_OTHER): Payer: BC Managed Care – PPO | Admitting: Surgery

## 2012-10-22 ENCOUNTER — Encounter (INDEPENDENT_AMBULATORY_CARE_PROVIDER_SITE_OTHER): Payer: Self-pay | Admitting: Surgery

## 2012-10-22 VITALS — BP 118/66 | HR 60 | Temp 99.2°F | Resp 14 | Ht 64.0 in | Wt 147.2 lb

## 2012-10-22 DIAGNOSIS — C73 Malignant neoplasm of thyroid gland: Secondary | ICD-10-CM

## 2012-10-22 NOTE — Progress Notes (Signed)
General Surgery [facility]  Chief Complaint  Patient presents with  . Post-op Problem    total thyroidectomy with lymph node dissection 10/18/2012    HISTORY: Patient is a 47 year old female who underwent total thyroidectomy with lymph node dissection on 10/18/2012 for metastatic papillary thyroid carcinoma. She has noted some soft tissue swelling and tightness in the neck and wished to be evaluated today.  EXAM: Surgical incision is healing nicely. Mild to moderate soft tissue swelling. No sign of seroma. No sign of hematoma. No sign of infection. Steri-Strips remain in place.  Chvostek's sign is negative.  IMPRESSION: Papillary thyroid carcinoma  PLAN: Patient will continue to use ice packs and to sit and sleep and a mildly elevated position. She will take hydrocodone as needed for pain. I have told her that she may divide her daily dose of Synthroid between 50 mcg in the morning and 50 mcg in the evening if she likes. This may help with anxiety.  Patient is scheduled to see her endocrinologist later this month. She will return to see me for a wound check in 2 weeks.  [name], MD, FACS General & Endocrine Surgery [facility]   Visit Diagnoses: 1. Thyroid cancer

## 2012-10-22 NOTE — Patient Instructions (Signed)

## 2012-11-02 ENCOUNTER — Encounter (INDEPENDENT_AMBULATORY_CARE_PROVIDER_SITE_OTHER): Payer: Self-pay | Admitting: Surgery

## 2012-11-02 ENCOUNTER — Ambulatory Visit (INDEPENDENT_AMBULATORY_CARE_PROVIDER_SITE_OTHER): Payer: BC Managed Care – PPO | Admitting: Surgery

## 2012-11-02 VITALS — BP 116/64 | HR 68 | Temp 98.9°F | Resp 14 | Ht 64.0 in | Wt 150.4 lb

## 2012-11-02 DIAGNOSIS — C73 Malignant neoplasm of thyroid gland: Secondary | ICD-10-CM

## 2012-11-02 NOTE — Patient Instructions (Signed)
  COCOA BUTTER & VITAMIN E CREAM  (Palmer's or other brand)  Apply cocoa butter/vitamin E cream to your incision 2 - 3 times daily.  Massage cream into incision for one minute with each application.  Use sunscreen (50 SPF or higher) for first 6 months after surgery if area is exposed to sun.  You may substitute Mederma or other scar reducing creams as desired.   

## 2012-11-02 NOTE — Progress Notes (Signed)
General Surgery [facility]  Chief Complaint  Patient presents with  . Routine Post Op    total thyroidectomy 10/18/2012    HISTORY: Patient is a 47 year old female who underwent total thyroidectomy with limited lymph node dissection for papillary thyroid carcinoma with lymph node metastases on 10/18/2012. Postoperatively she has done well. She is taking Synthroid 100 mcg daily. She will see her endocrinologist early next week to discuss radioactive iodine treatment. Postoperative calcium level is normal at 10.0.  EXAM: Wound is healing nicely. Steri-Strips are removed. Mild soft tissue swelling. No sign of infection. No sign of seroma. Voice is soft and slightly hoarse.  IMPRESSION: Status post total thyroidectomy and limited lymph node dissection for papillary thyroid carcinoma  PLAN: Patient will begin applying topical creams to her incision. She may discontinue supplemental calcium at this point in time.  Patient will see her endocrinologist next week to discuss radioactive iodine treatment.  Patient will return for surgical follow-up in 6-8 weeks.  [name], MD, FACS General & Endocrine Surgery [facility]   Visit Diagnoses: 1. Thyroid cancer

## 2012-11-13 ENCOUNTER — Encounter (INDEPENDENT_AMBULATORY_CARE_PROVIDER_SITE_OTHER): Payer: Self-pay

## 2012-11-14 ENCOUNTER — Telehealth (INDEPENDENT_AMBULATORY_CARE_PROVIDER_SITE_OTHER): Payer: Self-pay | Admitting: Surgery

## 2012-11-14 DIAGNOSIS — C73 Malignant neoplasm of thyroid gland: Secondary | ICD-10-CM

## 2012-11-14 NOTE — Telephone Encounter (Signed)
The patient has contacted our office to request an additional leave of absence from work until her next surgical assessment.  Patient submitted a written request to extend her leave of absence from work.  She wrote the following:  "Following total thyroidectomy and lymph node dissection for metastatic papillary thyroid carcinoma, I am having residual speaking, breathing, and swallowing difficulty. Adjustment to synthetic hormone replacement therapy with related side effects of gastrointestinal and sleep disturbances, nervousness, and mental fog.  Psychological adjustment related to diagnosis and cosmetic disfigurement. Diminished neck motility. 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 Speaking difficulty affecting the ability to effectively communicate with patient's, family, and staff via telephone or in person. Breathing difficulty affecting the ability to give quality patient care. Inability to turn had abruptly due to decrease neck mobility. Request leave of absence until 12/26/2012 at which date surgeon will reassess need at postoperative visit."  Will submit paperwork to extend leave as requested.  [name], MD, [facility] Office: [phone]

## 2012-11-15 ENCOUNTER — Other Ambulatory Visit: Payer: Self-pay

## 2012-11-26 ENCOUNTER — Ambulatory Visit: Payer: Self-pay

## 2012-12-04 ENCOUNTER — Ambulatory Visit: Payer: Self-pay

## 2012-12-26 ENCOUNTER — Encounter (INDEPENDENT_AMBULATORY_CARE_PROVIDER_SITE_OTHER): Payer: Self-pay | Admitting: Surgery

## 2012-12-26 ENCOUNTER — Ambulatory Visit (INDEPENDENT_AMBULATORY_CARE_PROVIDER_SITE_OTHER): Payer: BC Managed Care – PPO | Admitting: Surgery

## 2012-12-26 VITALS — BP 120/70 | HR 68 | Temp 98.4°F | Resp 14 | Ht 64.0 in | Wt 150.6 lb

## 2012-12-26 DIAGNOSIS — C73 Malignant neoplasm of thyroid gland: Secondary | ICD-10-CM

## 2012-12-26 NOTE — Progress Notes (Signed)
General Surgery [facility]  Chief Complaint  Patient presents with  . Routine Post Op    total thyroidectomy 10/18/2012    HISTORY: Patient is a 47 year old female who underwent total thyroidectomy with Limited central compartment lymph node dissection on 10/18/2012 for papillary thyroid carcinoma. Patient underwent total body iodine scan which showed no residual activity in the thyroid bed and no evidence of metastatic disease. Patient was subsequently treated with 127 mCi of I-131. She now continues management of her thyroid hormone replacement under the direction of her endocrinologist, Dr. Tedd Sias.  EXAM: Surgical incision is healing nicely. No soft tissue swelling. No seroma. Voice quality is normal. No sign of infection.  IMPRESSION: Status post total thyroidectomy for thyroid carcinoma  PLAN: Patient will continue to apply topical creams to her incision. She is released to return to work with no restrictions. She is scheduled to see her endocrinologist on 01/15/2013.  Patient will return for surgical care in April 2015.  [name], MD, FACS General & Endocrine Surgery [facility]   Visit Diagnoses: 1. Thyroid cancer

## 2012-12-26 NOTE — Patient Instructions (Signed)
  COCOA BUTTER & VITAMIN E CREAM  (Palmer's or other brand)  Apply cocoa butter/vitamin E cream to your incision 2 - 3 times daily.  Massage cream into incision for one minute with each application.  Use sunscreen (50 SPF or higher) for first 6 months after surgery if area is exposed to sun.  You may substitute Mederma or other scar reducing creams as desired.   

## 2013-01-01 ENCOUNTER — Ambulatory Visit: Payer: Self-pay | Admitting: Hematology and Oncology

## 2013-01-10 DIAGNOSIS — G459 Transient cerebral ischemic attack, unspecified: Secondary | ICD-10-CM

## 2013-01-10 HISTORY — DX: Transient cerebral ischemic attack, unspecified: G45.9

## 2013-01-18 ENCOUNTER — Ambulatory Visit: Payer: Self-pay | Admitting: Hematology and Oncology

## 2013-02-10 ENCOUNTER — Ambulatory Visit: Payer: Self-pay | Admitting: Hematology and Oncology

## 2013-02-15 ENCOUNTER — Ambulatory Visit: Payer: Self-pay | Admitting: Internal Medicine

## 2013-04-25 ENCOUNTER — Ambulatory Visit: Payer: Self-pay | Admitting: Hematology and Oncology

## 2013-05-01 ENCOUNTER — Encounter (INDEPENDENT_AMBULATORY_CARE_PROVIDER_SITE_OTHER): Payer: Self-pay | Admitting: Surgery

## 2013-05-01 ENCOUNTER — Ambulatory Visit (INDEPENDENT_AMBULATORY_CARE_PROVIDER_SITE_OTHER): Payer: BC Managed Care – PPO | Admitting: Surgery

## 2013-05-01 VITALS — BP 115/75 | HR 52 | Temp 98.0°F | Resp 14 | Ht 64.0 in | Wt 156.8 lb

## 2013-05-01 DIAGNOSIS — C73 Malignant neoplasm of thyroid gland: Secondary | ICD-10-CM

## 2013-05-01 NOTE — Progress Notes (Signed)
General Surgery [facility]  Chief Complaint  Patient presents with  . 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 She underwent total thyroidectomy with central compartment lymph node dissection in October 2014. She was treated at [facility] with radioactive iodine. There were some complications with this procedure and the final reports of her body scan and treatment were actually lost by the [facility]. It is hard to know if she was adequately treated or not.  Therefore she has establish care at [facility]. She is being followed closely by an endocrinologist and has scheduled for a total body iodine scan for June of 2015. I have asked her to forward me a copy of those study results when they are available.  Patient is currently taking Synthroid 125 mcg daily.  PERTINENT REVIEW OF SYSTEMS: Overall the patient is doing quite well. Voice quality is normal. No significant dysphagia. No significant pain.  EXAM: HEENT: normocephalic; pupils equal and reactive; sclerae clear; dentition good; mucous membranes moist NECK:  Well-healed surgical incision with good cosmetic result; no palpable masses in the thyroid bed; symmetric on extension; no palpable anterior or posterior cervical lymphadenopathy; no supraclavicular masses; no tenderness CHEST: clear to auscultation bilaterally without rales, rhonchi, or wheezes CARDIAC: regular rate and rhythm without significant murmur; peripheral pulses are full EXT:  non-tender without edema; no deformity NEURO: no gross focal deficits; no sign of tremor   IMPRESSION: Personal history of papillary thyroid carcinoma with local lymph node metastasis, no evidence of recurrent disease  PLAN: Patient will continue evaluation at [facility]. I  have asked her to forward me a copy of the total body iodine scan when it is performed in June 2015.  I plan to see the patient back for physical examination in 6 months.  Earnstine Regal, MD, [facility] Office: [phone]  Visit Diagnoses: 1. Thyroid cancer

## 2013-05-01 NOTE — Patient Instructions (Signed)

## 2013-05-10 ENCOUNTER — Ambulatory Visit: Payer: Self-pay | Admitting: Hematology and Oncology

## 2013-09-04 ENCOUNTER — Ambulatory Visit: Payer: Self-pay | Admitting: Internal Medicine

## 2013-10-16 ENCOUNTER — Ambulatory Visit: Payer: Self-pay | Admitting: Hematology and Oncology

## 2013-10-16 LAB — CBC CANCER CENTER
Basophil #: 0 x10 3/mm (ref 0.0–0.1)
Basophil %: 1.1 %
Eosinophil #: 0.1 x10 3/mm (ref 0.0–0.7)
Eosinophil %: 2.9 %
HCT: 45 % (ref 35.0–47.0)
HGB: 15.2 g/dL (ref 12.0–16.0)
Lymphocyte #: 0.8 x10 3/mm — ABNORMAL LOW (ref 1.0–3.6)
Lymphocyte %: 23 %
MCH: 31.3 pg (ref 26.0–34.0)
MCHC: 33.7 g/dL (ref 32.0–36.0)
MCV: 93 fL (ref 80–100)
Monocyte #: 0.2 x10 3/mm (ref 0.2–0.9)
Monocyte %: 6.5 %
NEUTROS ABS: 2.3 x10 3/mm (ref 1.4–6.5)
NEUTROS PCT: 66.5 %
PLATELETS: 188 x10 3/mm (ref 150–440)
RBC: 4.85 10*6/uL (ref 3.80–5.20)
RDW: 13.3 % (ref 11.5–14.5)
WBC: 3.5 x10 3/mm — AB (ref 3.6–11.0)

## 2013-10-16 LAB — BASIC METABOLIC PANEL
Anion Gap: 2 — ABNORMAL LOW (ref 7–16)
BUN: 9 mg/dL (ref 7–18)
CHLORIDE: 106 mmol/L (ref 98–107)
CREATININE: 0.68 mg/dL (ref 0.60–1.30)
Calcium, Total: 8.8 mg/dL (ref 8.5–10.1)
Co2: 31 mmol/L (ref 21–32)
EGFR (African American): >60
Glucose: 98 mg/dL (ref 65–99)
Osmolality: 276 (ref 275–301)
Potassium: 3.9 mmol/L (ref 3.5–5.1)
Sodium: 139 mmol/L (ref 136–145)

## 2013-10-16 LAB — TSH: THYROID STIMULATING HORM: 0.307 u[IU]/mL — AB

## 2013-10-16 LAB — HEMOGLOBIN A1C: Hemoglobin A1C: 5.4 % (ref 4.2–6.3)

## 2013-11-10 ENCOUNTER — Ambulatory Visit: Payer: Self-pay | Admitting: Hematology and Oncology

## 2013-12-03 ENCOUNTER — Emergency Department: Payer: Self-pay | Admitting: Emergency Medicine

## 2013-12-09 ENCOUNTER — Ambulatory Visit (INDEPENDENT_AMBULATORY_CARE_PROVIDER_SITE_OTHER): Payer: BC Managed Care – PPO | Admitting: Neurology

## 2013-12-09 ENCOUNTER — Encounter: Payer: Self-pay | Admitting: Neurology

## 2013-12-09 VITALS — BP 114/70 | HR 64 | Resp 16 | Ht 64.0 in | Wt 164.0 lb

## 2013-12-09 DIAGNOSIS — R404 Transient alteration of awareness: Secondary | ICD-10-CM

## 2013-12-09 DIAGNOSIS — R55 Syncope and collapse: Secondary | ICD-10-CM

## 2013-12-09 NOTE — Patient Instructions (Signed)
1. Schedule 48-hour EEG 2. Continue with your current medications 3. Follow-up with your other physicians as scheduled 4. Our office will call you with the EEG results

## 2013-12-09 NOTE — Progress Notes (Signed)
NEUROLOGY CONSULTATION NOTE  Priscilla Greene MRN: [id number] DOB: August 20, 1965  Referring provider: Dr. Jennings Books Primary care provider: Dr. Emily Filbert  Reason for consult:  Recurrent spells  Dear Dr Manuella Ghazi:  Thank you for your kind referral of Priscilla Greene for consultation of the above symptoms. Although her history is well known to you, please allow me to reiterate it for the purpose of our medical record. The patient was accompanied to the clinic by her daughter who also provides collateral information. Records and images were personally reviewed where available.  HISTORY OF PRESENT ILLNESS: This is a 48 year old right-handed woman with a complicated medical history presenting for evaluation of recurrent episodes of presycope with loss of awareness and 2 episodes of left facial weakness/numbness and speech difficulties. She has had syncopal episodes since age 22 or so. They quieted down, then recurred in the late 1990s after she had a hysterectomy. She had been evaluated by cardiologist Dr. Caryl Comes and underwent EP studies, tilt table testing, and loop recorder monitoring, where she was found to ventricular tachycardia. She was started on Flecainide and Nadolol and reports doing very well with infrequent syncopal episodes. She is familiar with the syncopal episodes where she would feel lightheaded, her peripheral vision grays out, hearing feels like she is under water, then she briefly loses consciousness. In 2010, she had a different episode where she woke up from sleep and felt disoriented and nauseated. She reports the left side of her face was drooped and felt numb, she had difficulty forming sentences, and everything was "slowed down and retarded." After the episode, she had the immediate urge to defecate. She had a similar episode in July 2015 and was diagnosed with a TIA. MRI brain, MRA head and neck were normal.   She had a syncopal episode at work in May 2014. At that time, she had a  head CT and was found to have a thyroid nodule. She was ultimately diagnosed with papillary thyroid carcinoma and has been followed at [facility]. She had a thyroidectomy and is currently on radioactive iodine treatments. She has been struggling since the thyroid surgery, and reports that she had to come off Synthroid last May 2015, and started having difficulties with bradycardia and low core body temperature. She was instructed to reduce Nadolol, then when instructed to increase dose back up, this is when she had the episode in July 2015.   She reports episodes of presyncope where she loses awareness of her surroundings. Most recently she was driving and told her daughter she did not feel good, like she would pass out. She does not recall putting the car into [place], but recalls her daughter calling her name to get her attention. This occurs around 3-4 times a week. Triggers include loud sounds, crowds, and eating meat. The last syncopal episode occurred a week ago, she had the same lightheaded sensation with vision graying out, then lost consciousness. She had an EEG in September where she had an episode of feeling unwell and shocking feeling in her hands after HV, with no EEG changes. She reports that since Synthroid was initially discontinued, she started having daily headaches. Despite being back on Synthroid, headaches continue but have decreased with magnesium supplements. She also has had word-finding and memory difficulties, she is unable to think of words and has had difficulty in school. She and her daughter report that she has a history of panic attacks and took Effexor in the past. These worsened when her  TSH was very low, and with thyroid medication adjustment, panic attacks are better. She has poor sleep, usually with only 4 hours of sleep.  She denies any patient denies any olfactory/gustatory hallucinations, deja vu, rising epigastric sensation, focal numbness/tingling/weakness, myoclonic jerks.  Several maternal uncles have Friedrich's ataxia. Her daughter has syncopal episodes. Otherwise she had a normal birth and early development.  There is no history of febrile convulsions, CNS infections such as meningitis/encephalitis, significant traumatic brain injury, neurosurgical procedures, or family history of seizures.  PAST MEDICAL HISTORY: Past Medical History  Diagnosis Date  . Atrial flutter   . Syncope   . History of hypotension   . Palpitations     Hx of  . Encounter for interrogation of cardiac recorder     Loop recorder  . White coat hypertension   . Postoperative pulmonary embolism     Hx of  . Asthma   . Nonsustained ventricular tachycardia   . PONV (postoperative nausea and vomiting)   . Headache(784.0)     migraines  . GERD (gastroesophageal reflux disease)   . Duodenal ulcer 1986  . Arthritis     rheumatoid    PAST SURGICAL HISTORY: Past Surgical History  Procedure Laterality Date  . Loop recorder      Implanted 2000, removed 2002  . Abdominal hysterectomy    . Vulvectomy      Partial  . Diagnostic laparoscopy    . Tonsillectomy    . Laparoscopy    . Cardiac electrophysiology mapping and ablation    . Left oophorectomy    . Thyroid lobectomy Bilateral 10/18/2012    Procedure: total thyroidectomy with limited central compartment lympy node disection;  Surgeon: Earnstine Regal, MD;  Location: [facility];  Service: General;  Laterality: Bilateral;    MEDICATIONS: Current Outpatient Prescriptions on File Prior to Visit  Medication Sig Dispense Refill  . acetaminophen (TYLENOL) 500 MG tablet Take 500 mg by mouth every 6 (six) hours as needed for pain.    Marland Kitchen aspirin 81 MG EC tablet Take 81 mg by mouth daily.      . Calcium Carbonate-Vit D-Min (CALTRATE PLUS PO) Take 1 tablet by mouth daily.    . flecainide (TAMBOCOR) 50 MG tablet Take 50 mg by mouth every 12 (twelve) hours.      Marland Kitchen levothyroxine (SYNTHROID) 100 MCG tablet Take 125 mcg by mouth daily.     .  vitamin E (VITAMIN E) 400 UNIT capsule Take 400 Units by mouth daily.     No current facility-administered medications on file prior to visit.    ALLERGIES: Allergies  Allergen Reactions  . Contrast Media [Iodinated Diagnostic Agents] Shortness Of Breath, Rash and Anaphylaxis    Contrast dye  . Amoxicillin-Pot Clavulanate     Diarrhea, vomiting  . Codeine     palpiataions  . Erythromycin     Vomiting,severe  . Etanercept Other (See Comments)    Pt has been advised in the past to not take this medication   . Meperidine Hcl     palpitations  . Prozac [Fluoxetine Hcl] Other (See Comments)    Fever, weight loss, manic  . Septra Ds [Sulfamethoxazole-Trimethoprim]   . Topamax [Topiramate] Other (See Comments)    Syncope with memory loss  . Vancomycin     FAMILY HISTORY: Family History  Problem Relation Age of Onset  . Heart disease Father   . Diabetes Mother     SOCIAL HISTORY: History   Social History  .  Marital Status: Married    Spouse Name: N/A    Number of Children: N/A  . Years of Education: N/A   Occupational History  . Not on file.   Social History Main Topics  . Smoking status: Never Smoker   . Smokeless tobacco: Never Used  . Alcohol Use: No  . Drug Use: No  . Sexual Activity: Not on file   Other Topics Concern  . Not on file   Social History Narrative    REVIEW OF SYSTEMS: Constitutional: No fevers, chills, or sweats, no generalized fatigue, change in appetite Eyes: No visual changes, double vision, eye pain Ear, nose and throat: No hearing loss, ear pain, nasal congestion, sore throat Cardiovascular: No chest pain,+ palpitations Respiratory:  No shortness of breath at rest or with exertion, wheezes GastrointestinaI: No nausea, vomiting, diarrhea, abdominal pain, fecal incontinence Genitourinary:  No dysuria, urinary retention or frequency Musculoskeletal:  No neck pain, back pain Integumentary: No rash, pruritus, skin lesions Neurological:  as above Psychiatric: No depression, +insomnia, anxiety Endocrine: No palpitations, fatigue, diaphoresis, mood swings, change in appetite, change in weight, increased thirst Hematologic/Lymphatic:  No anemia, purpura, petechiae. Allergic/Immunologic: no itchy/runny eyes, nasal congestion, recent allergic reactions, rashes  PHYSICAL EXAM: Filed Vitals:   12/09/13 1343  BP: 114/70  Pulse: 64  Resp: 16   General: No acute distress, becomes tearful several times when she has word-finding difficulties Head:  Normocephalic/atraumatic Eyes: Fundoscopic exam shows bilateral sharp discs, no vessel changes, exudates, or hemorrhages Neck: supple, no paraspinal tenderness, full range of motion Back: No paraspinal tenderness Heart: regular rate and rhythm Lungs: Clear to auscultation bilaterally. Vascular: No carotid bruits. Skin/Extremities: No rash, no edema Neurological Exam: Mental status: alert and oriented to person, place, and time, no dysarthria or aphasia, Fund of knowledge is appropriate.  Recent and remote memory are intact.  Attention and concentration are normal.    Able to name objects and repeat phrases. Cranial nerves: CN I: not tested CN II: pupils equal, round and reactive to light, visual fields intact, fundi unremarkable. CN III, IV, VI:  full range of motion, no nystagmus, no ptosis CN V: decreased cold on left V2-3, intact to pin. Did not split with tuning fork CN VII: upper and lower face symmetric CN VIII: hearing intact to finger rub CN IX, X: gag intact, uvula midline CN XI: sternocleidomastoid and trapezius muscles intact CN XII: tongue midline Bulk & Tone: normal, no fasciculations. Motor: 5/5 throughout with no pronator drift. Sensation: intact to light touch, cold, pin, vibration and joint position sense.  No extinction to double simultaneous stimulation.  Romberg test negative Deep Tendon Reflexes: +2 throughout, no ankle clonus Plantar responses: downgoing  bilaterally Cerebellar: no incoordination on finger to nose, heel to shin. No dysdiadochokinesia Gait: narrow-based and steady, able to tandem walk adequately. Tremor: none  IMPRESSION: This is a 48 year old right-handed woman with multiple medical issues including papillary thyroid cancer, pulmonary embolism, migraines, NSVT, recurrent syncope, presenting with several neurological symptoms including episodes of loss of awareness in the setting of presyncopal symptoms, 2 episodes of left facial numbness with word-finding difficulties, and cognitive dysfunction. MRI brain, MRA head/neck normal. Routine EEG normal. The etiology of her neurological symptoms is unclear, she does not have any epilepsy risk factors, neurological exam normal. A 48-hour EEG will be ordered to further classify the episodes of loss of awareness/presyncope. She has a follow-up with her cardiologist and [facility] clinic coming up. [place] driving laws were discussed with the  patient, and she knows not to drive after an episode of loss of awareness/consciousness, until 6 months event-free. Our office will call her with the results of the EEG, she will continue follow-up with Dr. Manuella Ghazi and knows to call our office for any questions.  Thank you for allowing me to participate in the care of this patient. Please do not hesitate to call for any questions or concerns.   Ellouise Newer, M.D.  CC: Dr. Manuella Ghazi

## 2013-12-11 ENCOUNTER — Encounter: Payer: Self-pay | Admitting: Neurology

## 2013-12-12 NOTE — Progress Notes (Signed)
Done

## 2013-12-24 ENCOUNTER — Telehealth: Payer: Self-pay | Admitting: Neurology

## 2013-12-24 NOTE — Telephone Encounter (Signed)
    °  recv'd request for records by mail from [place]. Request forwarded by inter-office mail to [facility] at [facility] / [place] [place]

## 2014-01-08 ENCOUNTER — Ambulatory Visit: Payer: Self-pay | Admitting: Hematology and Oncology

## 2014-01-08 LAB — BASIC METABOLIC PANEL
ANION GAP: 9 (ref 7–16)
BUN: 17 mg/dL (ref 7–18)
CALCIUM: 8.9 mg/dL (ref 8.5–10.1)
CO2: 33 mmol/L — AB (ref 21–32)
Chloride: 101 mmol/L (ref 98–107)
Creatinine: 0.68 mg/dL (ref 0.60–1.30)
EGFR (African American): >60
EGFR (Non-African Amer.): >60
Glucose: 108 mg/dL — ABNORMAL HIGH (ref 65–99)
Osmolality: 287 (ref 275–301)
Potassium: 4.6 mmol/L (ref 3.5–5.1)
SODIUM: 143 mmol/L (ref 136–145)

## 2014-01-08 LAB — CBC CANCER CENTER
BASOS PCT: 1.2 %
Basophil #: 0.1 x10 3/mm (ref 0.0–0.1)
EOS PCT: 1.4 %
Eosinophil #: 0.1 x10 3/mm (ref 0.0–0.7)
HCT: 45.4 % (ref 35.0–47.0)
HGB: 15.2 g/dL (ref 12.0–16.0)
Lymphocyte #: 0.7 x10 3/mm — ABNORMAL LOW (ref 1.0–3.6)
Lymphocyte %: 14.4 %
MCH: 30.1 pg (ref 26.0–34.0)
MCHC: 33.4 g/dL (ref 32.0–36.0)
MCV: 90 fL (ref 80–100)
MONO ABS: 0.3 x10 3/mm (ref 0.2–0.9)
MONOS PCT: 6.6 %
Neutrophil #: 3.8 x10 3/mm (ref 1.4–6.5)
Neutrophil %: 76.4 %
PLATELETS: 175 x10 3/mm (ref 150–440)
RBC: 5.04 10*6/uL (ref 3.80–5.20)
RDW: 13.6 % (ref 11.5–14.5)
WBC: 5 x10 3/mm (ref 3.6–11.0)

## 2014-01-08 LAB — HEPATIC FUNCTION PANEL A (ARMC)
ALT: 39 U/L
Albumin: 4 g/dL (ref 3.4–5.0)
Alkaline Phosphatase: 75 U/L
BILIRUBIN TOTAL: 0.5 mg/dL (ref 0.2–1.0)
Bilirubin, Direct: 0.1 mg/dL (ref 0.0–0.2)
SGOT(AST): 15 U/L (ref 15–37)
TOTAL PROTEIN: 7.3 g/dL (ref 6.4–8.2)

## 2014-01-08 LAB — TSH: Thyroid Stimulating Horm: 0.249 u[IU]/mL — ABNORMAL LOW

## 2014-01-08 LAB — MAGNESIUM: MAGNESIUM: 2.3 mg/dL

## 2014-01-09 LAB — LIPID PANEL
Cholesterol: 299 mg/dL — ABNORMAL HIGH (ref 0–200)
HDL: 59 mg/dL (ref 40–60)
Ldl Cholesterol, Calc: 209 mg/dL — ABNORMAL HIGH (ref 0–100)
TRIGLYCERIDES: 157 mg/dL (ref 0–200)
VLDL CHOLESTEROL, CALC: 31 mg/dL (ref 5–40)

## 2014-01-10 ENCOUNTER — Ambulatory Visit: Payer: Self-pay | Admitting: Hematology and Oncology

## 2014-02-03 ENCOUNTER — Ambulatory Visit (INDEPENDENT_AMBULATORY_CARE_PROVIDER_SITE_OTHER): Payer: BLUE CROSS/BLUE SHIELD | Admitting: Neurology

## 2014-02-03 DIAGNOSIS — R55 Syncope and collapse: Secondary | ICD-10-CM

## 2014-02-03 DIAGNOSIS — R404 Transient alteration of awareness: Secondary | ICD-10-CM

## 2014-02-14 ENCOUNTER — Telehealth: Payer: Self-pay | Admitting: Neurology

## 2014-02-14 NOTE — Telephone Encounter (Signed)
I spoke to patient regarding 48-hour EEG. No passing out but still has the presyncopal episodes, last was 3 days ago. She is scheduled to see [facility] on 05/06/14. Her EEG showed occasional focal slowing over the bilateral temporal regions, left >right. No clear epileptiform discharges. Typical events were not captured. She had an episode 2 days after taking off EEG. We discussed that slowing is non-specific and would not cause symptoms. Her MRI in July 2015 when she was already having these symptoms was normal. Discussed proceeding with Autonomic evaluation, and if further workup recommended by them, video EEG would be the next step for classification of episodes.

## 2014-02-25 NOTE — Procedures (Signed)
ELECTROENCEPHALOGRAM REPORT  Dates of Recording: 02/03/2014 to 02/05/2014  Patient's Name: Priscilla Greene MRN: [id number] Date of Birth: 11-Jul-1965  Referring Provider: Dr. Ellouise Newer  Procedure: 48-hour ambulatory EEG  History: This is a 49 year old woman with recurrent episodes of loss of awareness in the setting of presyncopal symptoms, 2 episodes of left facial numbness with word-finding difficulties, and cognitive dysfunction. EEG for classification.  Medications: Synthroid, Flecainide, aspirin  Technical Summary: This is a 48-hour multichannel digital EEG recording measured by the international 10-20 system with electrodes applied with paste and impedances below 5000 ohms performed as portable with EKG monitoring.  The digital EEG was referentially recorded, reformatted, and digitally filtered in a variety of bipolar and referential montages for optimal display.    DESCRIPTION OF RECORDING: During maximal wakefulness, the background activity consisted of a symmetric 10.5 Hz posterior dominant rhythm which was reactive to eye opening.  There was occasional focal 4-6 Hz theta slowing seen independently over the bilateral temporal regions, left greater than right. There were no epileptiform discharges seen in wakefulness.  During the recording, the patient progresses through wakefulness, drowsiness, and Stage 2 sleep. Similar occasional independent focal theta slowing is seen over the bilateral temporal regions, left greater than right, at times sharply contoured without clear epileptogenic potential.  Again, there were no epileptiform discharges seen.  Events: On 01/25 at 2030 hours, she reports trouble breathing. Electrographically, there were no EEG or EKG changes seen.  On 01/26 at 1815 hours, she reports cold sensation/skin crawling. Electrographically, there were no EEG or EKG changes seen.  There were no electrographic seizures seen.  EKG lead was unremarkable.  IMPRESSION:  This 48-hour ambulatory EEG study is abnormal due to occasional focal slowing over the bilateral temporal regions, left greater than right.  CLINICAL CORRELATION of the above findings indicates focal cerebral dysfunction over the bilateral temporal regions suggestive of underlying structural or physiologic abnormality. The absence of epileptiform discharges not exclude a clinical diagnosis of epilepsy.  Typical episodes of loss of awareness and paresthesias were not captured. If further clinical questions remain, inpatient video EEG monitoring may be helpful.   Ellouise Newer, M.D.

## 2014-03-18 ENCOUNTER — Telehealth: Payer: Self-pay | Admitting: Neurology

## 2014-03-18 NOTE — Telephone Encounter (Signed)
Sent disability determination to Medical records

## 2014-04-15 ENCOUNTER — Encounter: Payer: Self-pay | Admitting: *Deleted

## 2014-04-25 LAB — CREATININE, SERUM: Creatine, Serum: 0.68

## 2014-05-19 ENCOUNTER — Encounter: Payer: Self-pay | Admitting: Hematology and Oncology

## 2014-05-19 ENCOUNTER — Inpatient Hospital Stay (HOSPITAL_BASED_OUTPATIENT_CLINIC_OR_DEPARTMENT_OTHER): Payer: BLUE CROSS/BLUE SHIELD | Admitting: Hematology and Oncology

## 2014-05-19 ENCOUNTER — Inpatient Hospital Stay: Payer: BLUE CROSS/BLUE SHIELD | Attending: Hematology and Oncology

## 2014-05-19 VITALS — BP 138/89 | HR 64 | Temp 98.8°F | Ht 63.98 in | Wt 154.3 lb

## 2014-05-19 DIAGNOSIS — J45909 Unspecified asthma, uncomplicated: Secondary | ICD-10-CM | POA: Insufficient documentation

## 2014-05-19 DIAGNOSIS — R079 Chest pain, unspecified: Secondary | ICD-10-CM | POA: Diagnosis not present

## 2014-05-19 DIAGNOSIS — Z85850 Personal history of malignant neoplasm of thyroid: Secondary | ICD-10-CM

## 2014-05-19 DIAGNOSIS — Z79899 Other long term (current) drug therapy: Secondary | ICD-10-CM | POA: Diagnosis not present

## 2014-05-19 DIAGNOSIS — R001 Bradycardia, unspecified: Secondary | ICD-10-CM | POA: Diagnosis not present

## 2014-05-19 DIAGNOSIS — K219 Gastro-esophageal reflux disease without esophagitis: Secondary | ICD-10-CM | POA: Diagnosis not present

## 2014-05-19 DIAGNOSIS — C73 Malignant neoplasm of thyroid gland: Secondary | ICD-10-CM

## 2014-05-19 LAB — CBC
HCT: 46.3 % (ref 35.0–47.0)
Hemoglobin: 15.6 g/dL (ref 12.0–16.0)
MCH: 30.8 pg (ref 26.0–34.0)
MCHC: 33.8 g/dL (ref 32.0–36.0)
MCV: 91 fL (ref 80.0–100.0)
Platelets: 157 10*3/uL (ref 150–440)
RBC: 5.09 MIL/uL (ref 3.80–5.20)
RDW: 14.1 % (ref 11.5–14.5)
WBC: 3.9 10*3/uL (ref 3.6–11.0)

## 2014-05-19 LAB — COMPREHENSIVE METABOLIC PANEL
ALT: 23 U/L (ref 14–54)
AST: 18 U/L (ref 15–41)
Albumin: 4.7 g/dL (ref 3.5–5.0)
Alkaline Phosphatase: 49 U/L (ref 38–126)
Anion gap: 8 (ref 5–15)
BUN: 18 mg/dL (ref 6–20)
CO2: 30 mmol/L (ref 22–32)
Calcium: 9.5 mg/dL (ref 8.9–10.3)
Chloride: 101 mmol/L (ref 101–111)
Creatinine, Ser: 0.65 mg/dL (ref 0.44–1.00)
GFR calc Af Amer: >60 mL/min (ref >60)
GFR calc non Af Amer: >60 mL/min (ref >60)
Glucose, Bld: 102 mg/dL — ABNORMAL HIGH (ref 65–99)
Potassium: 4.4 mmol/L (ref 3.5–5.1)
Sodium: 139 mmol/L (ref 135–145)
Total Bilirubin: 0.9 mg/dL (ref 0.3–1.2)
Total Protein: 7.5 g/dL (ref 6.5–8.1)

## 2014-05-19 NOTE — Progress Notes (Signed)
Pt c/o chest pain and dizziness.  She reports a current burning sensation in left chest.  She experienced chest pain last evening that radiated to left jaw.  She took 4 ASA 81 mg tablets last night and then went back to bed. She states that she "felt better this morning", but now presents to clinic with "some mild burning chest pain"  Pt also declined thyroglobulin levels today in the lab. States that her endocrinologist is ordering the labs for thyroglobulin labs.

## 2014-05-23 ENCOUNTER — Encounter: Payer: Self-pay | Admitting: Hematology and Oncology

## 2014-06-22 NOTE — Progress Notes (Signed)
[facility] day:  05/19/2014  Chief Complaint: Priscilla Greene is an 49 y.o. female with a history of papillary thyroid carcinoma who is seen for reassessment  HPI:  The patient states while assisting with a bone marrow, she fainted and hit her head. Staff had trouble arousing her. CT scan after the syncopal event on 07/23/2012 revealed 3 small nodules in the right thyroid lobe with a dominant nodule measuring 1.43 cm with calcification.  Needle aspirate revealed a follicular lesion with mild cytologic atypia and minimal colloid content. There were mildly enlarged level III lymph nodes.  She underwent total thyroidectomy with lymph node dissection on 10/18/2012. Pathology revealed a 1.3 cm papillary thyroid carcinoma with angiolymphatic invasion and capsular Invasion. There was a microscopic foci of papillary thyroid carcinoma in 1 lymph node on the left.  Pathologic stage was T1bN1 (Stage III).   She received thyrogen Injections on 11/17 and 11/27/2012 followed by 127 mCi of I-131 on 12/04/2012. Total body iodine scan revealed no residual activity in the thyroid bed and no evidence of metastatic disease. She underwent I-131 on 04/10/2013 with 150 mCi of I-131 (off Synthroid for 6 weeks).   She is followed by cardiology. The patient describes ventricular tachycardia in 1999. She underwent an EP Study which revealed multifocal PVCs and was placed on Flecanide. She has been on medical management only. She took Nadolol 40 mg for 15 years.  The patient notes that she can have bradycardia down into the 40s. She has presyncopal episodes nearly daily. She lost consciousness 2 weeks ago. She has a tilt test scheduled on 06/19/2014. Tilt test was positive in the past (1999/2000).   Paraneoplastic autoantibodies were negative.  She is followed in the [facility] by Dr. Edrick Kins.  She was recently switched to Dix 1/2 (5.4TG). After  1 week, she felt terrible/worse.  He describes chest pain last night which radiated into her jaw. She took for baby aspirin and a glass of water and laid down. She also felt nauseated and clammy. She had some palpitations.  She did not call her cardiologist or seek medical attention.  She is followed by endocrinology. She believes that she has a resistance to I-131. She was last seen in follow-up in 12/2013. She notes that there are new positive scans and thyroglobulin which is detectable. She is scheduled for a stimulated study and ultrasound in 07/2014.  Past Medical History  Diagnosis Date  . Atrial flutter   . Syncope   . History of hypotension   . Palpitations     Hx of  . Encounter for interrogation of cardiac recorder     Loop recorder  . White coat hypertension   . Postoperative pulmonary embolism     Hx of  . Asthma   . Nonsustained ventricular tachycardia   . PONV (postoperative nausea and vomiting)   . Headache(784.0)     migraines  . GERD (gastroesophageal reflux disease)   . Duodenal ulcer 1986  . Arthritis     rheumatoid  . Thyroid cancer 10/18/2012    Papillary  . Thyroid carcinoma 10/18/2012    papillary  . Cancer     THYROID CANCER    Past Surgical History  Procedure Laterality Date  . Loop recorder      Implanted 2000, removed 2002  . Abdominal hysterectomy    . Vulvectomy      Partial  . Diagnostic laparoscopy    .  Tonsillectomy    . Laparoscopy    . Cardiac electrophysiology mapping and ablation    . Left oophorectomy    . Thyroid lobectomy Bilateral 10/18/2012    Procedure: total thyroidectomy with limited central compartment lympy node disection;  Surgeon: Earnstine Regal, MD;  Location: [facility];  Service: General;  Laterality: Bilateral;    Family History  Problem Relation Age of Onset  . Heart disease Father   . Diabetes Mother   . Breast cancer Other   . Colon cancer Other     Social History:  reports that she has never smoked. She has  never used smokeless tobacco. She reports that she does not drink alcohol or use illicit drugs.  She is currently out of school. She has an associates degree. She plans on working on her bachelor's in nursing. Her daughter graduates from [employer] on Saturday. The patient is alone today.  Allergies:  Allergies  Allergen Reactions  . Contrast Media [Iodinated Diagnostic Agents] Shortness Of Breath, Rash and Anaphylaxis    Contrast dye  . Amoxicillin-Pot Clavulanate     Diarrhea, vomiting  . Codeine     palpiataions  . Erythromycin     Vomiting,severe  . Etanercept Other (See Comments)    Pt has been advised in the past to not take this medication   . Meperidine Hcl     palpitations  . Prozac [Fluoxetine Hcl] Other (See Comments)    Fever, weight loss, manic  . Septra Ds [Sulfamethoxazole-Trimethoprim]   . Topamax [Topiramate] Other (See Comments)    Syncope with memory loss  . Vancomycin     Current Medications: Current Outpatient Prescriptions  Medication Sig Dispense Refill  . acetaminophen (TYLENOL) 500 MG tablet Take 500 mg by mouth every 6 (six) hours as needed for pain.    Marland Kitchen albuterol (PROVENTIL HFA;VENTOLIN HFA) 108 (90 BASE) MCG/ACT inhaler Inhale 1-2 puffs into the lungs every 6 (six) hours as needed for wheezing or shortness of breath.    Marland Kitchen aspirin 81 MG EC tablet Take 81 mg by mouth daily.      . budesonide (PULMICORT) 180 MCG/ACT inhaler Inhale 1 puff into the lungs 2 (two) times daily.    . Calcium Carbonate-Vit D-Min (CALTRATE PLUS PO) Take 1 tablet by mouth daily.    . flecainide (TAMBOCOR) 50 MG tablet Take 50 mg by mouth every 12 (twelve) hours.      Marland Kitchen levothyroxine (SYNTHROID) 100 MCG tablet Take 100 mcg by mouth daily. 100 mcg daily Every day but Sunday (in which she takes 75mg)    . MAGNESIUM GLYCINATE PLUS PO Take 120 mg by mouth daily.    . nebivolol (BYSTOLIC) 2.5 MG tablet Take 2.5 mg by mouth daily. 0.5 tablet every day.    . vitamin E (VITAMIN E)  400 UNIT capsule Take 400 Units by mouth daily.     No current facility-administered medications for this visit.    Review of Systems:  GENERAL:  Energy level is "awful".  Most energy is 1 hour in AM.  No fevers, sweats or weight loss. PERFORMANCE STATUS (ECOG):  2 HEENT:  No visual changes, runny nose, sore throat, mouth sores or tenderness. Lungs: No shortness of breath or cough.  No hemoptysis. Cardiac:  Palpitations.  Hypotension.  Chest pain last night. GI:  No nausea, vomiting, diarrhea, constipation, melena or hematochezia. GU:  No urgency, frequency, dysuria, or hematuria. Musculoskeletal:  No back pain.  No joint pain.  No muscle tenderness.  Extremities:  No pain or swelling. Skin:  No rashes or skin changes. Neuro:  No headache, numbness or weakness, balance or coordination issues. Endocrine:  Thyroid carcinoma on synthroid (controlled by endocrinology).  No diabetes, hot flashes or night sweats. Psych:  "Brain fog". No mood changes, depression or anxiety. Pain:  No focal pain. Review of systems:  All other systems reviewed and found to be negative.   Physical Exam: Blood pressure 138/89, pulse 64, temperature 98.8 F (37.1 C), temperature source Tympanic, height 5' 3.98" (1.625 m), weight 154 lb 5.2 oz (70.001 kg), SpO2 98 %.  GENERAL:  Well developed, well nourished, sitting comfortably in a wheelchair in the exam room in no acute distress. MENTAL STATUS:  Alert and oriented to person, place and time. HEAD:  Dark brown hair.  Normocephalic, atraumatic, face symmetric, no Cushingoid features. EYES:  Blue eyes.  Pupils equal round and reactive to light and accomodation.  No conjunctivitis or scleral icterus. ENT:  Oropharynx clear without lesion.  Tongue normal. Mucous membranes moist.  RESPIRATORY:  Clear to auscultation without rales, wheezes or rhonchi. CARDIOVASCULAR:  Regular rate and rhythm without murmur, rub or gallop. ABDOMEN:  Soft, non-tender, with active bowel  sounds, and no hepatosplenomegaly.  No masses. SKIN:  No rashes, ulcers or lesions. EXTREMITIES: No edema, no skin discoloration or tenderness.  No palpable cords. LYMPH NODES: No palpable cervical, supraclavicular, axillary or inguinal adenopathy  NEUROLOGICAL: Unremarkable. PSYCH:  Appropriate.   Appointment on 05/19/2014  Component Date Value Ref Range Status  . Sodium 05/19/2014 139  135 - 145 mmol/L Final  . Potassium 05/19/2014 4.4  3.5 - 5.1 mmol/L Final  . Chloride 05/19/2014 101  101 - 111 mmol/L Final  . CO2 05/19/2014 30  22 - 32 mmol/L Final  . Glucose, Bld 05/19/2014 102* 65 - 99 mg/dL Final  . BUN 05/19/2014 18  6 - 20 mg/dL Final  . Creatinine, Ser 05/19/2014 0.65  0.44 - 1.00 mg/dL Final  . Calcium 05/19/2014 9.5  8.9 - 10.3 mg/dL Final  . Total Protein 05/19/2014 7.5  6.5 - 8.1 g/dL Final  . Albumin 05/19/2014 4.7  3.5 - 5.0 g/dL Final  . AST 05/19/2014 18  15 - 41 U/L Final  . ALT 05/19/2014 23  14 - 54 U/L Final  . Alkaline Phosphatase 05/19/2014 49  38 - 126 U/L Final  . Total Bilirubin 05/19/2014 0.9  0.3 - 1.2 mg/dL Final  . GFR calc non Af Amer 05/19/2014 >60  >60 mL/min Final  . GFR calc Af Amer 05/19/2014 >60  >60 mL/min Final   Comment: (NOTE) The eGFR has been calculated using the CKD EPI equation. This calculation has not been validated in all clinical situations. eGFR's persistently <60 mL/min signify possible Chronic Kidney Disease.   . Anion gap 05/19/2014 8  5 - 15 Final  . WBC 05/19/2014 3.9  3.6 - 11.0 K/uL Final  . RBC 05/19/2014 5.09  3.80 - 5.20 MIL/uL Final  . Hemoglobin 05/19/2014 15.6  12.0 - 16.0 g/dL Final  . HCT 05/19/2014 46.3  35.0 - 47.0 % Final  . MCV 05/19/2014 91.0  80.0 - 100.0 fL Final  . MCH 05/19/2014 30.8  26.0 - 34.0 pg Final  . MCHC 05/19/2014 33.8  32.0 - 36.0 g/dL Final  . RDW 05/19/2014 14.1  11.5 - 14.5 % Final  . Platelets 05/19/2014 157  150 - 440 K/uL Final    Assessment:  Priscilla Greene is an 49 y.o.  female  with a history of stage III (T1BN1M0) papillary thyroid carcinoma status post total thyroidectomy on 10/18/2012.  Pathology revealed a 1.3 cm papillary thyroid carcinoma with angiolymphatic and capsular invasion. She has a microscopic focus focus of papillary thyroid carcinoma in 1 lymph node.   She received I-131 on 02/04/2012 and 040/01/2013. Per the patient, she may be resistant to I-131.  She is followed by [facility] endocrinology. She notes a recent follow-up in 12/2013 which revealed new positive scans and detectable thyroglobulin. Further evaluation is scheduled for 07/2014.  She has a history of ventricular tachycardia with multifocal PVCs and intermittent bradycardia and presyncopal episodes.  She is followed by the [facility] at [facility]. She had an episode of chest pain last night and did not seek medical attention.  Plan: 1. Review entire medical history thyroid carcinoma and treatment to date. 2. Discuss cardiac abnormalities and evaluation and treatment. Discuss need for patient to contact cardiologist regarding recent symptomatology. 3. Follow-up as scheduled with endocrinology.  Coordinate care. 4. Labs today;  CBC with diff, CMP.  Patient does not want thyroid testing performed. 5. RTC in 09/2014 for MD assessment and labs (CBC with diff, CMP).   Lequita Asal, MD  05/19/2014, 7:12 PM

## 2014-09-19 ENCOUNTER — Ambulatory Visit: Payer: BLUE CROSS/BLUE SHIELD | Admitting: Hematology and Oncology

## 2014-09-19 ENCOUNTER — Other Ambulatory Visit: Payer: BLUE CROSS/BLUE SHIELD

## 2014-11-07 ENCOUNTER — Other Ambulatory Visit: Payer: Self-pay | Admitting: Internal Medicine

## 2014-11-07 DIAGNOSIS — Z1231 Encounter for screening mammogram for malignant neoplasm of breast: Secondary | ICD-10-CM

## 2014-11-11 ENCOUNTER — Encounter: Payer: Self-pay | Admitting: Hematology and Oncology

## 2014-11-12 ENCOUNTER — Ambulatory Visit
Admission: RE | Admit: 2014-11-12 | Discharge: 2014-11-12 | Disposition: A | Discharge status: Home / Self Care | Payer: BLUE CROSS/BLUE SHIELD | Source: Ambulatory Visit | Attending: Internal Medicine | Admitting: Internal Medicine

## 2014-11-12 DIAGNOSIS — Z1231 Encounter for screening mammogram for malignant neoplasm of breast: Secondary | ICD-10-CM | POA: Diagnosis present

## 2014-11-18 ENCOUNTER — Other Ambulatory Visit: Payer: Self-pay

## 2014-11-18 DIAGNOSIS — C73 Malignant neoplasm of thyroid gland: Secondary | ICD-10-CM

## 2014-11-20 ENCOUNTER — Inpatient Hospital Stay: Payer: BLUE CROSS/BLUE SHIELD | Attending: Hematology and Oncology | Admitting: Hematology and Oncology

## 2014-11-20 ENCOUNTER — Inpatient Hospital Stay: Payer: BLUE CROSS/BLUE SHIELD

## 2014-11-20 VITALS — Ht 63.0 in | Wt 151.5 lb

## 2014-11-20 DIAGNOSIS — R55 Syncope and collapse: Secondary | ICD-10-CM | POA: Insufficient documentation

## 2014-11-20 DIAGNOSIS — I472 Ventricular tachycardia: Secondary | ICD-10-CM | POA: Diagnosis not present

## 2014-11-20 DIAGNOSIS — Z7982 Long term (current) use of aspirin: Secondary | ICD-10-CM | POA: Diagnosis not present

## 2014-11-20 DIAGNOSIS — Z8679 Personal history of other diseases of the circulatory system: Secondary | ICD-10-CM | POA: Insufficient documentation

## 2014-11-20 DIAGNOSIS — I10 Essential (primary) hypertension: Secondary | ICD-10-CM | POA: Diagnosis not present

## 2014-11-20 DIAGNOSIS — K219 Gastro-esophageal reflux disease without esophagitis: Secondary | ICD-10-CM | POA: Diagnosis not present

## 2014-11-20 DIAGNOSIS — Z85850 Personal history of malignant neoplasm of thyroid: Secondary | ICD-10-CM | POA: Insufficient documentation

## 2014-11-20 DIAGNOSIS — J45909 Unspecified asthma, uncomplicated: Secondary | ICD-10-CM | POA: Diagnosis not present

## 2014-11-20 DIAGNOSIS — C73 Malignant neoplasm of thyroid gland: Secondary | ICD-10-CM

## 2014-11-20 DIAGNOSIS — Z86711 Personal history of pulmonary embolism: Secondary | ICD-10-CM | POA: Insufficient documentation

## 2014-11-20 DIAGNOSIS — Z79899 Other long term (current) drug therapy: Secondary | ICD-10-CM | POA: Diagnosis not present

## 2014-11-20 LAB — COMPREHENSIVE METABOLIC PANEL
ALT: 17 U/L (ref 14–54)
AST: 19 U/L (ref 15–41)
Albumin: 4.5 g/dL (ref 3.5–5.0)
Alkaline Phosphatase: 62 U/L (ref 38–126)
Anion gap: 11 (ref 5–15)
BUN: 24 mg/dL — ABNORMAL HIGH (ref 6–20)
CO2: 29 mmol/L (ref 22–32)
Calcium: 9.3 mg/dL (ref 8.9–10.3)
Chloride: 98 mmol/L — ABNORMAL LOW (ref 101–111)
Creatinine, Ser: 0.79 mg/dL (ref 0.44–1.00)
GFR calc Af Amer: >60 mL/min (ref >60)
GFR calc non Af Amer: >60 mL/min (ref >60)
Glucose, Bld: 106 mg/dL — ABNORMAL HIGH (ref 65–99)
Potassium: 4.5 mmol/L (ref 3.5–5.1)
Sodium: 138 mmol/L (ref 135–145)
Total Bilirubin: 0.9 mg/dL (ref 0.3–1.2)
Total Protein: 7.1 g/dL (ref 6.5–8.1)

## 2014-11-20 LAB — CBC WITH DIFFERENTIAL/PLATELET
Basophils Absolute: 0 10*3/uL (ref 0–0.1)
Basophils Relative: 1 %
Eosinophils Absolute: 0.1 10*3/uL (ref 0–0.7)
Eosinophils Relative: 2 %
HCT: 43.9 % (ref 35.0–47.0)
Hemoglobin: 14.8 g/dL (ref 12.0–16.0)
Lymphocytes Relative: 22 %
Lymphs Abs: 1.2 10*3/uL (ref 1.0–3.6)
MCH: 29.9 pg (ref 26.0–34.0)
MCHC: 33.7 g/dL (ref 32.0–36.0)
MCV: 88.6 fL (ref 80.0–100.0)
Monocytes Absolute: 0.4 10*3/uL (ref 0.2–0.9)
Monocytes Relative: 7 %
Neutro Abs: 3.7 10*3/uL (ref 1.4–6.5)
Neutrophils Relative %: 68 %
Platelets: 216 10*3/uL (ref 150–440)
RBC: 4.95 MIL/uL (ref 3.80–5.20)
RDW: 13.6 % (ref 11.5–14.5)
WBC: 5.4 10*3/uL (ref 3.6–11.0)

## 2014-11-20 NOTE — Progress Notes (Signed)
[facility] day:  11/20/2014  Chief Complaint: Priscilla Greene is an 49 y.o. female with a history of papillary thyroid carcinoma who is seen for 6 month assessment  HPI:  The patient was last seen in the medical oncology clinic on 05/19/2014.  At that time, she was seen for initial assessment by me.  She received I-131 on 02/04/2012 and 04/10/2013. She was being followed by [facility] endocrinology. Per her report, scans from 12/2013 were positive with a detectable thyroglobulin. Further evaluation was scheduled for 07/2014.  She also had a history of ventricular tachycardia with multifocal PVCs and intermittent bradycardia and presyncopal episodes.  She was being followed by the [facility] at [facility]. She had noted an episode of chest pain on 05/18/2014 for which she did not seek medical attention.  During the interim, she notes a chronic syncopal episodes (3 times a day).  She cannot drive.  She is housebound.  She feels like she lives at the [facility].  She has a follow-up with [facility] cardiology for possible pacemaker.  With the tilt table test, she had the "inappropriate tachycardia". She started on by Bystolic, but did not tolerate this.  She has been on Nadolol and Midodrine for 3 days.  The patient has had scans regarding her thyroid cancer in 07/2014.  Thyroglobulin had previously been elevated. She notes a new finding in the mandibular gland on the right. PET scan is planned.  She has a thyroglobulin checked every 8 weeks. She is on Levothyroxine 100 g a day.   Past Medical History  Diagnosis Date  . Atrial flutter ([place])   . Syncope   . History of hypotension   . Palpitations     Hx of  . Encounter for interrogation of cardiac recorder     Loop recorder  . White coat hypertension   . Postoperative pulmonary embolism ([facility])     Hx of  . Asthma   . Nonsustained ventricular tachycardia ([place])   . PONV (postoperative nausea and  vomiting)   . Headache(784.0)     migraines  . GERD (gastroesophageal reflux disease)   . Duodenal ulcer 1986  . Arthritis     rheumatoid  . Thyroid cancer ([place]) 10/18/2012    Papillary  . Thyroid carcinoma ([place]) 10/18/2012    papillary  . Cancer [facility])     THYROID CANCER    Past Surgical History  Procedure Laterality Date  . Loop recorder      Implanted 2000, removed 2002  . Abdominal hysterectomy    . Vulvectomy      Partial  . Diagnostic laparoscopy    . Tonsillectomy    . Laparoscopy    . Cardiac electrophysiology mapping and ablation    . Left oophorectomy    . Thyroid lobectomy Bilateral 10/18/2012    Procedure: total thyroidectomy with limited central compartment lympy node disection;  Surgeon: Earnstine Regal, MD;  Location: [facility];  Service: General;  Laterality: Bilateral;    Family History  Problem Relation Age of Onset  . Heart disease Father   . Diabetes Mother   . Breast cancer Other   . Colon cancer Other     Social History:  reports that she has never smoked. She has never used smokeless tobacco. She reports that she does not drink alcohol or use illicit drugs.  She is on disability.  She is taking classes. She has an associates degree. She plans on working  on her bachelor's in nursing. The patient is alone today.  Allergies:  Allergies  Allergen Reactions  . Contrast Media [Iodinated Diagnostic Agents] Shortness Of Breath, Rash and Anaphylaxis    Contrast dye  . Amoxicillin-Pot Clavulanate     Diarrhea, vomiting  . Bystolic [Nebivolol Hcl] Nausea Only    tremors  . Codeine     palpiataions  . Erythromycin     Vomiting,severe  . Etanercept Other (See Comments)    Pt has been advised in the past to not take this medication   . Meperidine Hcl     palpitations  . Prozac [Fluoxetine Hcl] Other (See Comments)    Fever, weight loss, manic  . Septra Ds [Sulfamethoxazole-Trimethoprim]   . Topamax [Topiramate] Other (See Comments)    Syncope with  memory loss  . Vancomycin     Current Medications: Current Outpatient Prescriptions  Medication Sig Dispense Refill  . acetaminophen (TYLENOL) 500 MG tablet Take 500 mg by mouth every 6 (six) hours as needed for pain.    Marland Kitchen albuterol (PROVENTIL HFA;VENTOLIN HFA) 108 (90 BASE) MCG/ACT inhaler Inhale 1-2 puffs into the lungs every 6 (six) hours as needed for wheezing or shortness of breath.    Marland Kitchen albuterol (PROVENTIL) (2.5 MG/3ML) 0.083% nebulizer solution Inhale into the lungs.    . ALPRAZolam (XANAX) 0.25 MG tablet Take by mouth.    Marland Kitchen aspirin 81 MG EC tablet Take 81 mg by mouth daily.      . budesonide (PULMICORT) 180 MCG/ACT inhaler Inhale 1 puff into the lungs 2 (two) times daily.    . Calcium Carbonate-Vit D-Min (CALTRATE PLUS PO) Take 1 tablet by mouth daily.    . flecainide (TAMBOCOR) 50 MG tablet Take 50 mg by mouth every 12 (twelve) hours.      . fluconazole (DIFLUCAN) 150 MG tablet Take by mouth.    . levothyroxine (SYNTHROID) 100 MCG tablet Take 100 mcg by mouth daily.     Marland Kitchen MAGNESIUM GLYCINATE PLUS PO Take 120 mg by mouth daily.    . midodrine (PROAMATINE) 2.5 MG tablet Take by mouth.    . montelukast (SINGULAIR) 10 MG tablet Take by mouth.    . nadolol (CORGARD) 20 MG tablet Take by mouth.    . vitamin E (VITAMIN E) 400 UNIT capsule Take 400 Units by mouth daily.    . diphenhydrAMINE (BENADRYL) 25 mg capsule Take by mouth.     No current facility-administered medications for this visit.    Review of Systems:  GENERAL:  Fatigue.  No fevers or sweats.  Weight loss of 3 pounds. PERFORMANCE STATUS (ECOG):  3 HEENT:  No visual changes, runny nose, sore throat, mouth sores or tenderness. Lungs: No shortness of breath or cough.  No hemoptysis. Cardiac:  Palpitations.  Hypotension.  Syncope.  No orthopnea or PND. GI:  No nausea, vomiting, diarrhea, constipation, melena or hematochezia. GU:  No urgency, frequency, dysuria, or hematuria. Musculoskeletal:  No back pain.  No joint  pain.  No muscle tenderness. Extremities:  No pain or swelling. Skin:  No rashes or skin changes. Neuro:  No headache, numbness or weakness, balance or coordination issues. Endocrine:  Thyroid carcinoma on synthroid (controlled by endocrinology).  No diabetes, hot flashes or night sweats. Psych:  Tearful secondary to cardiac issues and disability.  No anxiety. Pain:  No focal pain. Review of systems:  All other systems reviewed and found to be negative.   Physical Exam: Height 5' 3" (1.6 m), weight 151 lb  7.3 oz (68.7 kg).  GENERAL:  Well developed, well nourished, sitting comfortably in a wheelchair in the exam room in no acute distress.  She is tearful at times. MENTAL STATUS:  Alert and oriented to person, place and time. HEAD:  Dark brown hair.  Normocephalic, atraumatic, face symmetric, no Cushingoid features. EYES:  Blue eyes.  Pupils equal round and reactive to light and accomodation.  No conjunctivitis or scleral icterus. ENT:  Oropharynx clear without lesion.  Tongue normal. Mucous membranes moist.  RESPIRATORY:  Clear to auscultation without rales, wheezes or rhonchi. CARDIOVASCULAR:  Regular rate and rhythm without murmur, rub or gallop. ABDOMEN:  Slightly tender RUQ without guarding or rebound tenderness.  Soft, with active bowel sounds, and no hepatosplenomegaly.  No masses. SKIN:  No rashes, ulcers or lesions. EXTREMITIES: No edema, no skin discoloration or tenderness.  No palpable cords. LYMPH NODES: No palpable cervical, supraclavicular, axillary or inguinal adenopathy  NEUROLOGICAL: Unremarkable. PSYCH:  Appropriate.   Appointment on 11/20/2014  Component Date Value Ref Range Status  . WBC 11/20/2014 5.4  3.6 - 11.0 K/uL Final  . RBC 11/20/2014 4.95  3.80 - 5.20 MIL/uL Final  . Hemoglobin 11/20/2014 14.8  12.0 - 16.0 g/dL Final  . HCT 11/20/2014 43.9  35.0 - 47.0 % Final  . MCV 11/20/2014 88.6  80.0 - 100.0 fL Final  . MCH 11/20/2014 29.9  26.0 - 34.0 pg Final  .  MCHC 11/20/2014 33.7  32.0 - 36.0 g/dL Final  . RDW 11/20/2014 13.6  11.5 - 14.5 % Final  . Platelets 11/20/2014 216  150 - 440 K/uL Final  . Neutrophils Relative % 11/20/2014 68   Final  . Neutro Abs 11/20/2014 3.7  1.4 - 6.5 K/uL Final  . Lymphocytes Relative 11/20/2014 22   Final  . Lymphs Abs 11/20/2014 1.2  1.0 - 3.6 K/uL Final  . Monocytes Relative 11/20/2014 7   Final  . Monocytes Absolute 11/20/2014 0.4  0.2 - 0.9 K/uL Final  . Eosinophils Relative 11/20/2014 2   Final  . Eosinophils Absolute 11/20/2014 0.1  0 - 0.7 K/uL Final  . Basophils Relative 11/20/2014 1   Final  . Basophils Absolute 11/20/2014 0.0  0 - 0.1 K/uL Final  . Sodium 11/20/2014 138  135 - 145 mmol/L Final  . Potassium 11/20/2014 4.5  3.5 - 5.1 mmol/L Final  . Chloride 11/20/2014 98* 101 - 111 mmol/L Final  . CO2 11/20/2014 29  22 - 32 mmol/L Final  . Glucose, Bld 11/20/2014 106* 65 - 99 mg/dL Final  . BUN 11/20/2014 24* 6 - 20 mg/dL Final  . Creatinine, Ser 11/20/2014 0.79  0.44 - 1.00 mg/dL Final  . Calcium 11/20/2014 9.3  8.9 - 10.3 mg/dL Final  . Total Protein 11/20/2014 7.1  6.5 - 8.1 g/dL Final  . Albumin 11/20/2014 4.5  3.5 - 5.0 g/dL Final  . AST 11/20/2014 19  15 - 41 U/L Final  . ALT 11/20/2014 17  14 - 54 U/L Final  . Alkaline Phosphatase 11/20/2014 62  38 - 126 U/L Final  . Total Bilirubin 11/20/2014 0.9  0.3 - 1.2 mg/dL Final  . GFR calc non Af Amer 11/20/2014 >60  >60 mL/min Final  . GFR calc Af Amer 11/20/2014 >60  >60 mL/min Final   Comment: (NOTE) The eGFR has been calculated using the CKD EPI equation. This calculation has not been validated in all clinical situations. eGFR's persistently <60 mL/min signify possible Chronic Kidney Disease.   Marland Kitchen  Anion gap 11/20/2014 11  5 - 15 Final    Assessment:  Priscilla Greene is an 49 y.o. female with a history of stage III (T1BN1M0) papillary thyroid carcinoma status post total thyroidectomy on 10/18/2012.  Pathology revealed a 1.3 cm papillary  thyroid carcinoma with angiolymphatic and capsular invasion. She has a microscopic focus focus of papillary thyroid carcinoma in 1 lymph node.   She received I-131 on 02/04/2012 and 04/10/2013. Per the patient, she may be resistant to I-131.  She is followed by [facility] endocrinology.  Follow-up in 12/2013 which revealed new positive scans and detectable thyroglobulin. Further evaluation was performed in 07/2014.  She notes a new finding in the mandibular gland on the right.  Thyroglobulin is checked every 8 weeks. She is on Levothyroxine 100 g a day.  She has a history of ventricular tachycardia with multifocal PVCs and intermittent bradycardia and presyncopal episodes.  She is followed by the [place] [facility] at [facility].   Medications are being tried.  Symptomatically, she is disabled because of her cardiac issues.  She has 3 syncopal episodes a day.  She is housebound.  Exam is stable.  Plan: 1. Labs today:  CBC with diff, CMP. 2. Follow-up as scheduled with endocrinology.  Patient wishes all testing with endocrinology.  Coordinate care. 3. RTC 6 months for MD assessment and labs (CBC with diff, CMP).   Lequita Asal, MD  11/20/2014, 3:26 PM

## 2015-02-22 ENCOUNTER — Encounter: Payer: Self-pay | Admitting: Hematology and Oncology

## 2015-05-21 ENCOUNTER — Ambulatory Visit: Payer: BLUE CROSS/BLUE SHIELD | Admitting: Hematology and Oncology

## 2015-05-21 ENCOUNTER — Other Ambulatory Visit: Payer: BLUE CROSS/BLUE SHIELD

## 2015-10-19 ENCOUNTER — Inpatient Hospital Stay: Payer: BLUE CROSS/BLUE SHIELD

## 2015-10-19 ENCOUNTER — Inpatient Hospital Stay: Payer: Self-pay | Admitting: Hematology and Oncology

## 2015-10-19 ENCOUNTER — Other Ambulatory Visit: Payer: Self-pay | Admitting: Internal Medicine

## 2015-10-19 DIAGNOSIS — Z1231 Encounter for screening mammogram for malignant neoplasm of breast: Secondary | ICD-10-CM

## 2015-11-11 ENCOUNTER — Ambulatory Visit
Admission: RE | Admit: 2015-11-11 | Discharge: 2015-11-11 | Disposition: A | Discharge status: Home / Self Care | Payer: BLUE CROSS/BLUE SHIELD | Source: Ambulatory Visit | Attending: Internal Medicine | Admitting: Internal Medicine

## 2015-11-11 DIAGNOSIS — Z1231 Encounter for screening mammogram for malignant neoplasm of breast: Secondary | ICD-10-CM | POA: Insufficient documentation

## 2015-11-11 DIAGNOSIS — R928 Other abnormal and inconclusive findings on diagnostic imaging of breast: Secondary | ICD-10-CM | POA: Diagnosis not present

## 2015-11-13 ENCOUNTER — Ambulatory Visit: Payer: Self-pay

## 2015-11-16 ENCOUNTER — Other Ambulatory Visit: Payer: Self-pay | Admitting: Internal Medicine

## 2015-11-16 DIAGNOSIS — R928 Other abnormal and inconclusive findings on diagnostic imaging of breast: Secondary | ICD-10-CM

## 2015-11-26 ENCOUNTER — Ambulatory Visit
Admission: RE | Admit: 2015-11-26 | Discharge: 2015-11-26 | Disposition: A | Discharge status: Home / Self Care | Payer: BLUE CROSS/BLUE SHIELD | Source: Ambulatory Visit | Attending: Internal Medicine | Admitting: Internal Medicine

## 2015-11-26 DIAGNOSIS — N649 Disorder of breast, unspecified: Secondary | ICD-10-CM | POA: Insufficient documentation

## 2015-11-26 DIAGNOSIS — R928 Other abnormal and inconclusive findings on diagnostic imaging of breast: Secondary | ICD-10-CM

## 2016-01-14 ENCOUNTER — Other Ambulatory Visit: Payer: Self-pay

## 2016-01-14 ENCOUNTER — Ambulatory Visit: Payer: Self-pay | Admitting: Hematology and Oncology

## 2016-08-16 ENCOUNTER — Other Ambulatory Visit: Payer: Self-pay | Admitting: Student

## 2016-08-16 DIAGNOSIS — R1310 Dysphagia, unspecified: Secondary | ICD-10-CM

## 2016-08-19 ENCOUNTER — Ambulatory Visit
Admission: RE | Admit: 2016-08-19 | Discharge: 2016-08-19 | Disposition: A | Discharge status: Home / Self Care | Payer: BLUE CROSS/BLUE SHIELD | Source: Ambulatory Visit | Attending: Student | Admitting: Student

## 2016-08-19 DIAGNOSIS — R1310 Dysphagia, unspecified: Secondary | ICD-10-CM | POA: Insufficient documentation

## 2016-08-19 DIAGNOSIS — K219 Gastro-esophageal reflux disease without esophagitis: Secondary | ICD-10-CM | POA: Insufficient documentation

## 2016-08-19 DIAGNOSIS — K224 Dyskinesia of esophagus: Secondary | ICD-10-CM | POA: Diagnosis not present

## 2016-08-19 DIAGNOSIS — K449 Diaphragmatic hernia without obstruction or gangrene: Secondary | ICD-10-CM | POA: Diagnosis not present

## 2016-08-19 DIAGNOSIS — K222 Esophageal obstruction: Secondary | ICD-10-CM | POA: Diagnosis not present

## 2016-09-21 ENCOUNTER — Ambulatory Visit: Payer: BLUE CROSS/BLUE SHIELD | Admitting: Anesthesiology

## 2016-09-21 ENCOUNTER — Encounter
Admission: RE | Disposition: A | Discharge status: Home / Self Care | Payer: Self-pay | Source: Ambulatory Visit | Attending: Unknown Physician Specialty

## 2016-09-21 ENCOUNTER — Encounter: Payer: Self-pay | Admitting: *Deleted

## 2016-09-21 ENCOUNTER — Ambulatory Visit
Admission: RE | Admit: 2016-09-21 | Discharge: 2016-09-21 | Disposition: A | Discharge status: Home / Self Care | Payer: BLUE CROSS/BLUE SHIELD | Source: Ambulatory Visit | Attending: Unknown Physician Specialty | Admitting: Unknown Physician Specialty

## 2016-09-21 DIAGNOSIS — I472 Ventricular tachycardia: Secondary | ICD-10-CM | POA: Insufficient documentation

## 2016-09-21 DIAGNOSIS — K219 Gastro-esophageal reflux disease without esophagitis: Secondary | ICD-10-CM | POA: Diagnosis not present

## 2016-09-21 DIAGNOSIS — Z8673 Personal history of transient ischemic attack (TIA), and cerebral infarction without residual deficits: Secondary | ICD-10-CM | POA: Insufficient documentation

## 2016-09-21 DIAGNOSIS — K640 First degree hemorrhoids: Secondary | ICD-10-CM | POA: Diagnosis not present

## 2016-09-21 DIAGNOSIS — Z85850 Personal history of malignant neoplasm of thyroid: Secondary | ICD-10-CM | POA: Diagnosis not present

## 2016-09-21 DIAGNOSIS — R1310 Dysphagia, unspecified: Secondary | ICD-10-CM | POA: Diagnosis not present

## 2016-09-21 DIAGNOSIS — Z791 Long term (current) use of non-steroidal anti-inflammatories (NSAID): Secondary | ICD-10-CM | POA: Insufficient documentation

## 2016-09-21 DIAGNOSIS — Q439 Congenital malformation of intestine, unspecified: Secondary | ICD-10-CM | POA: Insufficient documentation

## 2016-09-21 DIAGNOSIS — K449 Diaphragmatic hernia without obstruction or gangrene: Secondary | ICD-10-CM | POA: Insufficient documentation

## 2016-09-21 DIAGNOSIS — I10 Essential (primary) hypertension: Secondary | ICD-10-CM | POA: Insufficient documentation

## 2016-09-21 DIAGNOSIS — Z86711 Personal history of pulmonary embolism: Secondary | ICD-10-CM | POA: Diagnosis not present

## 2016-09-21 DIAGNOSIS — Z7951 Long term (current) use of inhaled steroids: Secondary | ICD-10-CM | POA: Insufficient documentation

## 2016-09-21 DIAGNOSIS — Z1211 Encounter for screening for malignant neoplasm of colon: Secondary | ICD-10-CM | POA: Insufficient documentation

## 2016-09-21 DIAGNOSIS — R197 Diarrhea, unspecified: Secondary | ICD-10-CM | POA: Diagnosis not present

## 2016-09-21 DIAGNOSIS — M069 Rheumatoid arthritis, unspecified: Secondary | ICD-10-CM | POA: Diagnosis not present

## 2016-09-21 DIAGNOSIS — Z79899 Other long term (current) drug therapy: Secondary | ICD-10-CM | POA: Insufficient documentation

## 2016-09-21 DIAGNOSIS — Z8371 Family history of colonic polyps: Secondary | ICD-10-CM | POA: Diagnosis not present

## 2016-09-21 DIAGNOSIS — J45909 Unspecified asthma, uncomplicated: Secondary | ICD-10-CM | POA: Insufficient documentation

## 2016-09-21 HISTORY — PX: COLONOSCOPY WITH PROPOFOL: SHX5780

## 2016-09-21 HISTORY — DX: Transient cerebral ischemic attack, unspecified: G45.9

## 2016-09-21 HISTORY — PX: ESOPHAGOGASTRODUODENOSCOPY (EGD) WITH PROPOFOL: SHX5813

## 2016-09-21 HISTORY — PX: SAVORY DILATION: SHX5439

## 2016-09-21 SURGERY — COLONOSCOPY WITH PROPOFOL
Anesthesia: General

## 2016-09-21 MED ORDER — GLYCOPYRROLATE 0.2 MG/ML IJ SOLN
INTRAMUSCULAR | Status: DC | PRN
  Administered 2016-09-21: 0.2 mg via INTRAVENOUS

## 2016-09-21 MED ORDER — IPRATROPIUM-ALBUTEROL 0.5-2.5 (3) MG/3ML IN SOLN
RESPIRATORY_TRACT | Status: AC
  Administered 2016-09-21: 3 mL via RESPIRATORY_TRACT
  Filled 2016-09-21: qty 3

## 2016-09-21 MED ORDER — PROPOFOL 500 MG/50ML IV EMUL
INTRAVENOUS | Status: DC | PRN
Start: 2016-09-21 — End: 2016-09-21
  Administered 2016-09-21: 100 ug/kg/min via INTRAVENOUS

## 2016-09-21 MED ORDER — PROPOFOL 10 MG/ML IV BOLUS
INTRAVENOUS | Status: DC | PRN
  Administered 2016-09-21: 50 mg via INTRAVENOUS
  Administered 2016-09-21 (×3): 30 mg via INTRAVENOUS
  Administered 2016-09-21: 20 mg via INTRAVENOUS

## 2016-09-21 MED ORDER — IPRATROPIUM-ALBUTEROL 0.5-2.5 (3) MG/3ML IN SOLN
3.0000 mL | Freq: Four times a day (QID) | RESPIRATORY_TRACT | Status: DC
  Administered 2016-09-21: 3 mL via RESPIRATORY_TRACT

## 2016-09-21 MED ORDER — PROPOFOL 10 MG/ML IV BOLUS
INTRAVENOUS | Status: AC
  Filled 2016-09-21: qty 20

## 2016-09-21 MED ORDER — ONDANSETRON HCL 4 MG/2ML IJ SOLN
INTRAMUSCULAR | Status: AC
  Filled 2016-09-21: qty 2

## 2016-09-21 MED ORDER — MIDAZOLAM HCL 2 MG/2ML IJ SOLN
INTRAMUSCULAR | Status: AC
  Filled 2016-09-21: qty 4

## 2016-09-21 MED ORDER — GLYCOPYRROLATE 0.2 MG/ML IJ SOLN
INTRAMUSCULAR | Status: AC
Start: 2016-09-21 — End: ?
  Filled 2016-09-21: qty 1

## 2016-09-21 MED ORDER — LIDOCAINE HCL (CARDIAC) 20 MG/ML IV SOLN
INTRAVENOUS | Status: DC | PRN
  Administered 2016-09-21: 80 mg via INTRATRACHEAL

## 2016-09-21 MED ORDER — SODIUM CHLORIDE 0.9 % IV SOLN: INTRAVENOUS | Status: DC

## 2016-09-21 MED ORDER — ATROPINE SULFATE 0.4 MG/ML IJ SOLN
INTRAMUSCULAR | Status: AC
  Filled 2016-09-21: qty 1

## 2016-09-21 MED ORDER — SODIUM CHLORIDE 0.9 % IV SOLN
INTRAVENOUS | Status: DC
  Administered 2016-09-21 (×2): via INTRAVENOUS

## 2016-09-21 MED ORDER — ONDANSETRON HCL 4 MG/2ML IJ SOLN
INTRAMUSCULAR | Status: DC | PRN
  Administered 2016-09-21: 4 mg via INTRAVENOUS

## 2016-09-21 MED ORDER — MIDAZOLAM HCL 2 MG/2ML IJ SOLN
INTRAMUSCULAR | Status: DC | PRN
  Administered 2016-09-21 (×2): 2 mg via INTRAVENOUS

## 2016-09-21 NOTE — Op Note (Signed)
[facility] Gastroenterology Patient Name: Priscilla Greene Procedure Date: 09/21/2016 2:01 PM MRN: [id number] Account #: [id number] Date of Birth: 11/01/1965 Admit Type: Outpatient Age: 51 Room: [facility] ENDO ROOM 3 Gender: Female Note Status: Finalized Procedure:            Colonoscopy Indications:          Colon cancer screening in patient at increased risk:                        Family history of 1st-degree relative with colon polyps Providers:            Manya Silvas, MD Referring MD:         Rusty Aus, MD (Referring MD) Medicines:            Propofol per Anesthesia Complications:        No immediate complications. Procedure:            Pre-Anesthesia Assessment:                       - After reviewing the risks and benefits, the patient                        was deemed in satisfactory condition to undergo the                        procedure.                       After obtaining informed consent, the colonoscope was                        passed under direct vision. Throughout the procedure,                        the patient's blood pressure, pulse, and oxygen                        saturations were monitored continuously. The                        Colonoscope was introduced through the anus and                        advanced to the the cecum, identified by appendiceal                        orifice and ileocecal valve. The colonoscopy was                        somewhat difficult due to a tortuous colon. Successful                        completion of the procedure was aided by applying                        abdominal pressure. The patient tolerated the procedure                        well. The quality of the bowel preparation was  excellent. Findings:      Internal hemorrhoids were found during endoscopy. The hemorrhoids were       small and Grade I (internal hemorrhoids that do not prolapse).      The exam was otherwise  without abnormality. Due to diarrhea I biopsied       the transverse and descending colon. The lining looked normal. Impression:           - Internal hemorrhoids.                       - The examination was otherwise normal.                       - No specimens collected. Recommendation:       - Await pathology results. Manya Silvas, MD 09/21/2016 2:40:30 PM This report has been signed electronically. Number of Addenda: 0 Note Initiated On: 09/21/2016 2:01 PM Scope Withdrawal Time: 0 hours 9 minutes 8 seconds  Total Procedure Duration: 0 hours 19 minutes 53 seconds       [facility]

## 2016-09-21 NOTE — Anesthesia Postprocedure Evaluation (Signed)
Anesthesia Post Note  Patient: Priscilla Greene  Procedure(s) Performed: Procedure(s) (LRB): COLONOSCOPY WITH PROPOFOL (N/A) ESOPHAGOGASTRODUODENOSCOPY (EGD) WITH PROPOFOL (N/A) SAVORY DILATION (N/A)  Patient location during evaluation: PACU Anesthesia Type: General Level of consciousness: awake and alert and oriented Pain management: pain level controlled Vital Signs Assessment: post-procedure vital signs reviewed and stable Respiratory status: spontaneous breathing Cardiovascular status: blood pressure returned to baseline Anesthetic complications: no     Last Vitals:  Vitals:   09/21/16 1459 09/21/16 1501  BP: 126/73 126/73  Pulse: 67 66  Resp: 16 15  Temp: (!) 36.1 C (!) 36.2 C  SpO2: 100% 100%    Last Pain:  Vitals:   09/21/16 1501  TempSrc: Tympanic                 Orlinda Slomski

## 2016-09-21 NOTE — Anesthesia Post-op Follow-up Note (Signed)
Anesthesia QCDR form completed.        

## 2016-09-21 NOTE — H&P (Signed)
Primary Care Physician:  Rusty Aus, MD Primary Gastroenterologist:  Dr. Vira Agar  Pre-Procedure History & Physical: HPI:  Priscilla Greene is a 51 y.o. female is here for an endoscopy and colonoscopy.   Past Medical History:  Diagnosis Date  . Arthritis    rheumatoid  . Asthma   . Atrial flutter ([place])   . Cancer [facility])    THYROID CANCER  . Duodenal ulcer 1986  . Encounter for interrogation of cardiac recorder    Loop recorder  . GERD (gastroesophageal reflux disease)   . Headache(784.0)    migraines  . History of hypotension   . Nonsustained ventricular tachycardia ([place])   . Palpitations    Hx of  . PONV (postoperative nausea and vomiting)   . Postoperative pulmonary embolism (H[facility])    Hx of  . Syncope   . 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 TIA (transient ischemic attack) 2015  . White coat hypertension     Past Surgical History:  Procedure Laterality Date  . ABDOMINAL HYSTERECTOMY    . CARDIAC ELECTROPHYSIOLOGY MAPPING AND ABLATION    . DIAGNOSTIC LAPAROSCOPY    . LAPAROSCOPY    . LEFT OOPHORECTOMY    . LOOP RECORDER     Implanted 2000, removed 2002  . THYROID LOBECTOMY Bilateral 10/18/2012   Procedure: total thyroidectomy with limited central compartment lympy node disection;  Surgeon: Earnstine Regal, MD;  Location: [facility] ORS;  Service: General;  Laterality: Bilateral;  . TONSILLECTOMY    . VULVECTOMY     Partial    Prior to Admission medications   Medication Sig Start Date End Date Taking? Authorizing Provider  acetaminophen (TYLENOL) 500 MG tablet Take 500 mg by mouth every 6 (six) hours as needed for pain.   Yes [provider]  albuterol (PROVENTIL HFA;VENTOLIN HFA) 108 (90 BASE) MCG/ACT inhaler Inhale 1-2 puffs into the lungs every 6 (six) hours as needed for wheezing or shortness of breath.   Yes [provider]  ALPRAZolam Duanne Moron) 0.25 MG tablet Take by mouth. 10/16/14  Yes  [provider]  aspirin 81 MG EC tablet Take 81 mg by mouth daily.     Yes [provider]  Calcium Carb-Cholecalciferol (CALCIUM 500 +D) 500-400 MG-UNIT TABS Take 1 tablet by mouth.   Yes [provider]  cetirizine HCl (ZYRTEC) 5 MG/5ML SOLN 5 mg daily.   Yes [provider]  Diphenhyd-Hydrocort-Nystatin (FIRST-DUKES MOUTHWASH) SUSP Swish and spit 5 mLs 6 (six) times daily. As needed   Yes [provider]  flecainide (TAMBOCOR) 50 MG tablet Take 50 mg by mouth every 12 (twelve) hours.     Yes [provider]  fluticasone (FLOVENT HFA) 110 MCG/ACT inhaler Inhale into the lungs 2 (two) times daily.   Yes [provider]  gentamicin ointment (GARAMYCIN) 0.1 % Apply 1 application topically 3 (three) times daily.   Yes [provider]  ibuprofen (ADVIL,MOTRIN) 200 MG tablet Take 200 mg by mouth every 6 (six) hours as needed.   Yes [provider]  levothyroxine (SYNTHROID, LEVOTHROID) 112 MCG tablet Take 112 mcg by mouth daily before breakfast.   Yes [provider]  MAGNESIUM GLYCINATE PLUS PO Take 120 mg by mouth daily.   Yes [provider]  midodrine (PROAMATINE) 2.5 MG tablet Take 2.5 mg by mouth 2 (two) times daily with a meal.   Yes [provider]  nadolol (  CORGARD) 20 MG tablet Take by mouth. 06/19/14  Yes [provider]  predniSONE (DELTASONE) 50 MG tablet Take 50 mg by mouth See admin instructions. 1 tab every 6 hours for 3 doses. Take them 13 hours, 7 hours and 1 hour prior to the study   Yes [provider]  vitamin E (VITAMIN E) 400 UNIT capsule Take 400 Units by mouth daily.   Yes [provider]  Calcium Carbonate-Vit D-Min (CALTRATE PLUS PO) Take 1 tablet by mouth daily.    [provider]  diphenhydrAMINE (BENADRYL) 25 mg capsule Take by mouth.    [provider]  montelukast (SINGULAIR) 10 MG tablet Take by mouth. 11/20/14 11/20/15   [provider]    Allergies as of 08/23/2016 - Review Complete 02/22/2015  Allergen Reaction Noted  . Contrast media [iodinated diagnostic agents] Shortness Of Breath, Rash, and Anaphylaxis 10/04/2012  . Amoxicillin-pot clavulanate    . Bystolic [nebivolol hcl] Nausea Only 11/20/2014  . Codeine    . Erythromycin    . Etanercept Other (See Comments) 12/09/2013  . Meperidine hcl    . Prozac [fluoxetine hcl] Other (See Comments) 12/09/2013  . Septra ds [sulfamethoxazole-trimethoprim]  10/11/2012  . Topamax [topiramate] Other (See Comments) 09/03/2012  . Vancomycin      Family History  Problem Relation Age of Onset  . Heart disease Father   . Diabetes Mother   . Breast cancer Mother   . Breast cancer Other   . Colon cancer Other     Social History   Social History  . Marital status: Married    Spouse name: N/A  . Number of children: N/A  . Years of education: N/A   Occupational History  . Not on file.   Social History Main Topics  . Smoking status: Never Smoker  . Smokeless tobacco: Never Used  . Alcohol use No  . Drug use: No  . Sexual activity: Yes    Birth control/ protection: Surgical   Other Topics Concern  . Not on file   Social History Narrative  . No narrative on file    Review of Systems: See HPI, otherwise negative ROS  Physical Exam: BP 126/73   Pulse 66   Temp (!) 97.2 F (36.2 C) (Tympanic)   Resp 15   Ht 5\' 4"  (1.626 m)   Wt 69.9 kg (154 lb)   SpO2 100%   BMI 26.43 kg/m  General:   Alert,  pleasant and cooperative in NAD Head:  Normocephalic and atraumatic. Neck:  Supple; no masses or thyromegaly. Lungs:  Clear throughout to auscultation.    Heart:  Regular rate and rhythm. Abdomen:  Soft, nontender and nondistended. Normal bowel sounds, without guarding, and without rebound.   Neurologic:  Alert and  oriented x4;  grossly normal neurologically.  Impression/Plan: Priscilla Greene is here for an endoscopy and colonoscopy to  be performed for dysphagia and FH colon polyps.  Risks, benefits, limitations, and alternatives regarding  endoscopy and colonoscopy have been reviewed with the patient.  Questions have been answered.  All parties agreeable.   Gaylyn Cheers, MD  09/21/2016, 3:24 PM

## 2016-09-21 NOTE — Transfer of Care (Signed)
Immediate Anesthesia Transfer of Care Note  Patient: GENNETTE SHADIX  Procedure(s) Performed: Procedure(s): COLONOSCOPY WITH PROPOFOL (N/A) ESOPHAGOGASTRODUODENOSCOPY (EGD) WITH PROPOFOL (N/A) SAVORY DILATION (N/A)  Patient Location: PACU and Endoscopy Unit  Anesthesia Type:General  Level of Consciousness: awake, alert , oriented and patient cooperative  Airway & Oxygen Therapy: Patient Spontanous Breathing  Post-op Assessment: Report given to RN, Post -op Vital signs reviewed and stable and Patient moving all extremities  Post vital signs: Reviewed and stable  Last Vitals:  Vitals:   09/21/16 1304  BP: (!) 147/89  Pulse: 70  Resp: 20  Temp: (!) 36.4 C  SpO2: 100%    Last Pain:  Vitals:   09/21/16 1304  TempSrc: Tympanic         Complications: No apparent anesthesia complications

## 2016-09-21 NOTE — Op Note (Signed)
[facility] Gastroenterology Patient Name: Priscilla Greene Procedure Date: 09/21/2016 2:01 PM MRN: [id number] Account #: [id number] Date of Birth: 10/12/1965 Admit Type: Outpatient Age: 51 Room: [facility] ENDO ROOM 3 Gender: Female Note Status: Finalized Procedure:            Upper GI endoscopy Indications:          Dysphagia Providers:            Manya Silvas, MD Referring MD:         Rusty Aus, MD (Referring MD) Medicines:            Propofol per Anesthesia Complications:        No immediate complications. Procedure:            Pre-Anesthesia Assessment:                       - After reviewing the risks and benefits, the patient                        was deemed in satisfactory condition to undergo the                        procedure.                       After obtaining informed consent, the endoscope was                        passed under direct vision. Throughout the procedure,                        the patient's blood pressure, pulse, and oxygen                        saturations were monitored continuously. The                        Colonoscope was introduced through the mouth, and                        advanced to the second part of duodenum. The upper GI                        endoscopy was accomplished without difficulty. The                        patient tolerated the procedure well. Findings:      The examined esophagus was normal. GEJ 40cm. After complete exam I       passed a guidewire and and the scope was withdrawn. Dilation was       performed with a Savary dilator with no resistance at 48 Fr.      The stomach was normal. A very small hiatal hernia was seen.      The examined duodenum was normal. Impression:           - Normal esophagus. Dilated.                       - Normal stomach.                       - Normal  examined duodenum.                       - No specimens collected. Recommendation:       - soft food for 3 days, eat  slowly, chew well, take                        small bites Manya Silvas, MD 09/21/2016 2:57:07 PM This report has been signed electronically. Number of Addenda: 0 Note Initiated On: 09/21/2016 2:01 PM      [facility]

## 2016-09-21 NOTE — Anesthesia Preprocedure Evaluation (Signed)
Anesthesia Evaluation  Patient identified by MRN, date of birth, ID band Patient awake    Reviewed: Allergy & Precautions, H&P , NPO status , Patient's Chart, lab work & pertinent test results  History of Anesthesia Complications (+) PONV and history of anesthetic complications  Airway Mallampati: III  TM Distance: >3 FB Neck ROM: full    Dental  (+) Poor Dentition, Chipped, Missing, Partial Upper   Pulmonary asthma ,           Cardiovascular Exercise Tolerance: Good hypertension, (-) angina(-) Past MI and (-) DOE      Neuro/Psych  Headaches, TIAnegative neurological ROS  negative psych ROS   GI/Hepatic negative GI ROS, Neg liver ROS, PUD, GERD  Medicated and Controlled,  Endo/Other  negative endocrine ROS  Renal/GU negative Renal ROS  negative genitourinary   Musculoskeletal  (+) Arthritis ,   Abdominal   Peds  Hematology negative hematology ROS (+)   Anesthesia Other Findings Past Medical History: No date: Arthritis     Comment:  rheumatoid No date: Asthma No date: Atrial flutter (HCC) No date: Cancer ([place])     Comment:  THYROID CANCER 1986: Duodenal ulcer No date: Encounter for interrogation of cardiac recorder     Comment:  Loop recorder No date: GERD (gastroesophageal reflux disease) No date: Headache(784.0)     Comment:  migraines No date: History of hypotension No date: Nonsustained ventricular tachycardia (HCC) No date: Palpitations     Comment:  Hx of No date: PONV (postoperative nausea and vomiting) No date: Postoperative pulmonary embolism (HCC)     Comment:  Hx of No date: Syncope 10/18/2012: Thyroid cancer ([place])     Comment:  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[place])     Comment:  papillary 2015: TIA (transient ischemic attack) No date: White coat hypertension  Past Surgical History: No date: ABDOMINAL HYSTERECTOMY No date: CARDIAC ELECTROPHYSIOLOGY MAPPING AND ABLATION No  date: DIAGNOSTIC LAPAROSCOPY No date: LAPAROSCOPY No date: LEFT OOPHORECTOMY No date: LOOP RECORDER     Comment:  Implanted 2000, removed 2002 10/18/2012: THYROID LOBECTOMY; Bilateral     Comment:  Procedure: total thyroidectomy with limited central               compartment lympy node disection;  Surgeon: Earnstine Regal, MD;  Location: [facility] ORS;  Service: General;                Laterality: Bilateral; No date: TONSILLECTOMY No date: VULVECTOMY     Comment:  Partial     Reproductive/Obstetrics negative OB ROS                             Anesthesia Physical Anesthesia Plan  ASA: IV  Anesthesia Plan: General   Post-op Pain Management:    Induction: Intravenous  PONV Risk Score and Plan: Propofol infusion  Airway Management Planned: Natural Airway and Nasal Cannula  Additional Equipment:   Intra-op Plan:   Post-operative Plan:   Informed Consent: I have reviewed the patients History and Physical, chart, labs and discussed the procedure including the risks, benefits and alternatives for the proposed anesthesia with the patient or authorized representative who has indicated his/her understanding and acceptance.   Dental Advisory Given  Plan Discussed with: Anesthesiologist, CRNA and Surgeon  Anesthesia Plan Comments: (Patient informed that they are higher risk for complications from anesthesia during this procedure  due to their medical history.  Patient voiced understanding.  Patient consented for risks of anesthesia including but not limited to:  - adverse reactions to medications - risk of intubation if required - damage to teeth, lips or other oral mucosa - sore throat or hoarseness - Damage to heart, brain, lungs or loss of life  Patient voiced understanding.)        Anesthesia Quick Evaluation

## 2016-09-22 ENCOUNTER — Encounter: Payer: Self-pay | Admitting: Unknown Physician Specialty

## 2016-09-23 LAB — SURGICAL PATHOLOGY

## 2016-10-18 ENCOUNTER — Other Ambulatory Visit: Payer: Self-pay | Admitting: Internal Medicine

## 2016-10-18 DIAGNOSIS — Z1231 Encounter for screening mammogram for malignant neoplasm of breast: Secondary | ICD-10-CM

## 2016-11-16 ENCOUNTER — Ambulatory Visit
Admission: RE | Admit: 2016-11-16 | Discharge: 2016-11-16 | Disposition: A | Discharge status: Home / Self Care | Payer: BLUE CROSS/BLUE SHIELD | Source: Ambulatory Visit | Attending: Internal Medicine | Admitting: Internal Medicine

## 2016-11-16 DIAGNOSIS — Z1231 Encounter for screening mammogram for malignant neoplasm of breast: Secondary | ICD-10-CM | POA: Diagnosis not present

## 2016-11-20 ENCOUNTER — Emergency Department
Admission: EM | Admit: 2016-11-20 | Discharge: 2016-11-20 | Disposition: A | Discharge status: Home / Self Care | Payer: BLUE CROSS/BLUE SHIELD | Attending: Emergency Medicine | Admitting: Emergency Medicine

## 2016-11-20 ENCOUNTER — Other Ambulatory Visit: Payer: Self-pay

## 2016-11-20 DIAGNOSIS — Z79899 Other long term (current) drug therapy: Secondary | ICD-10-CM | POA: Diagnosis not present

## 2016-11-20 DIAGNOSIS — N949 Unspecified condition associated with female genital organs and menstrual cycle: Secondary | ICD-10-CM

## 2016-11-20 DIAGNOSIS — I10 Essential (primary) hypertension: Secondary | ICD-10-CM | POA: Diagnosis not present

## 2016-11-20 DIAGNOSIS — Z8673 Personal history of transient ischemic attack (TIA), and cerebral infarction without residual deficits: Secondary | ICD-10-CM | POA: Diagnosis not present

## 2016-11-20 DIAGNOSIS — Z7982 Long term (current) use of aspirin: Secondary | ICD-10-CM | POA: Insufficient documentation

## 2016-11-20 DIAGNOSIS — N3001 Acute cystitis with hematuria: Secondary | ICD-10-CM

## 2016-11-20 DIAGNOSIS — M791 Myalgia, unspecified site: Secondary | ICD-10-CM | POA: Diagnosis not present

## 2016-11-20 DIAGNOSIS — N898 Other specified noninflammatory disorders of vagina: Secondary | ICD-10-CM | POA: Insufficient documentation

## 2016-11-20 DIAGNOSIS — J45909 Unspecified asthma, uncomplicated: Secondary | ICD-10-CM | POA: Diagnosis not present

## 2016-11-20 LAB — CBC WITH DIFFERENTIAL/PLATELET
Basophils Absolute: 0.1 10*3/uL (ref 0–0.1)
Basophils Relative: 1 %
Eosinophils Absolute: 0.2 10*3/uL (ref 0–0.7)
Eosinophils Relative: 2 %
HCT: 44.4 % (ref 35.0–47.0)
HEMOGLOBIN: 15 g/dL (ref 12.0–16.0)
LYMPHS ABS: 1.1 10*3/uL (ref 1.0–3.6)
LYMPHS PCT: 12 %
MCH: 30.4 pg (ref 26.0–34.0)
MCHC: 33.9 g/dL (ref 32.0–36.0)
MCV: 89.6 fL (ref 80.0–100.0)
Monocytes Absolute: 0.4 10*3/uL (ref 0.2–0.9)
Monocytes Relative: 4 %
NEUTROS ABS: 7.2 10*3/uL — AB (ref 1.4–6.5)
NEUTROS PCT: 81 %
Platelets: 208 10*3/uL (ref 150–440)
RBC: 4.95 MIL/uL (ref 3.80–5.20)
RDW: 14 % (ref 11.5–14.5)
WBC: 9 10*3/uL (ref 3.6–11.0)

## 2016-11-20 LAB — BASIC METABOLIC PANEL
Anion gap: 11 (ref 5–15)
BUN: 16 mg/dL (ref 6–20)
CHLORIDE: 105 mmol/L (ref 101–111)
CO2: 24 mmol/L (ref 22–32)
Calcium: 9 mg/dL (ref 8.9–10.3)
Creatinine, Ser: 0.57 mg/dL (ref 0.44–1.00)
GFR calc Af Amer: >60 mL/min (ref >60)
GFR calc non Af Amer: >60 mL/min (ref >60)
GLUCOSE: 103 mg/dL — AB (ref 65–99)
POTASSIUM: 4.1 mmol/L (ref 3.5–5.1)
SODIUM: 140 mmol/L (ref 135–145)

## 2016-11-20 LAB — WET PREP, GENITAL
Clue Cells Wet Prep HPF POC: NONE SEEN
Sperm: NONE SEEN
Trich, Wet Prep: NONE SEEN
Yeast Wet Prep HPF POC: NONE SEEN

## 2016-11-20 LAB — URINALYSIS, COMPLETE (UACMP) WITH MICROSCOPIC
BACTERIA UA: NONE SEEN
BILIRUBIN URINE: NEGATIVE
GLUCOSE, UA: NEGATIVE mg/dL
KETONES UR: NEGATIVE mg/dL
NITRITE: NEGATIVE
PH: 8 (ref 5.0–8.0)
Protein, ur: NEGATIVE mg/dL
SPECIFIC GRAVITY, URINE: 1.002 — AB (ref 1.005–1.030)
Squamous Epithelial / LPF: NONE SEEN

## 2016-11-20 LAB — CK: CK TOTAL: 27 U/L — AB (ref 38–234)

## 2016-11-20 MED ORDER — HYDROCODONE-ACETAMINOPHEN 5-325 MG PO TABS: 1.0000 | ORAL_TABLET | ORAL | 0 refills | Status: AC | PRN

## 2016-11-20 MED ORDER — DIPHENHYDRAMINE HCL 50 MG/ML IJ SOLN
25.0000 mg | Freq: Once | INTRAMUSCULAR | Status: AC
  Administered 2016-11-20: 25 mg via INTRAVENOUS
  Filled 2016-11-20: qty 1

## 2016-11-20 MED ORDER — METHYLPREDNISOLONE SODIUM SUCC 125 MG IJ SOLR
INTRAMUSCULAR | Status: AC
  Administered 2016-11-20: 125 mg via INTRAVENOUS
  Filled 2016-11-20: qty 2

## 2016-11-20 MED ORDER — ONDANSETRON HCL 4 MG/2ML IJ SOLN
4.0000 mg | Freq: Once | INTRAMUSCULAR | Status: AC
  Administered 2016-11-20: 4 mg via INTRAVENOUS
  Filled 2016-11-20: qty 2

## 2016-11-20 MED ORDER — METHYLPREDNISOLONE SODIUM SUCC 125 MG IJ SOLR
125.0000 mg | Freq: Once | INTRAMUSCULAR | Status: AC
  Administered 2016-11-20: 125 mg via INTRAVENOUS

## 2016-11-20 MED ORDER — SODIUM CHLORIDE 0.9 % IV BOLUS (SEPSIS)
1000.0000 mL | Freq: Once | INTRAVENOUS | Status: AC
  Administered 2016-11-20: 1000 mL via INTRAVENOUS

## 2016-11-20 MED ORDER — CIPROFLOXACIN HCL 500 MG PO TABS: 500.0000 mg | ORAL_TABLET | Freq: Two times a day (BID) | ORAL | 0 refills | Status: DC

## 2016-11-20 MED ORDER — ONDANSETRON 4 MG PO TBDP: 4.0000 mg | ORAL_TABLET | Freq: Three times a day (TID) | ORAL | 0 refills | Status: DC | PRN

## 2016-11-20 MED ORDER — MORPHINE SULFATE (PF) 4 MG/ML IV SOLN
4.0000 mg | Freq: Once | INTRAVENOUS | Status: AC
  Administered 2016-11-20: 4 mg via INTRAVENOUS
  Filled 2016-11-20: qty 1

## 2016-11-20 NOTE — ED Notes (Signed)
Patient presented erythema along the vascular access device. Orders rcvd from NP

## 2016-11-20 NOTE — ED Triage Notes (Signed)
Pt arrives to ED via POV from home with c/o vaginal burning since 1130 pm last night. Pt denies vaginal bleeding or d/c. Pt denies change in urinary habits or frequency. Pt does report (+) nausea yesterday, but denies emesis. Pt states that she "just didn't feel well at all yesterday". Pt also reports last radiation t/x for thyroid cancer in Feb of 2018. Pt is A&O, in NAD; RR even, regular and unlabored; skin color/temp is WNL.

## 2016-11-20 NOTE — ED Provider Notes (Signed)
[facility] Emergency Department Provider Note  ____________________________________________  Time seen: Approximately 8:38 AM  I have reviewed the triage vital signs and the nursing notes.   HISTORY  Chief Complaint Vaginal Pain    HPI Priscilla Greene is a 51 y.o. female who presents to the emergency department for evaluation of multiple medical complaints.She has had a myriad of  symptoms over the past 2 weeks. She reports receiving her pneumonia vaccination and has not felt normal since. She reports diffuse myalgias, erythematous rash on right upper arm that resolved with Desonide, and fever blister. At 2300 last night, she began to have severe vaginal burning. She applied vaginal cream that had been prescribed in the past for yeast. This caused the burning to increase. She washed it off and applied Xylocaine gel, which also caused severe burning. She rinsed that off and tried to just lay back down but by 0300 she was up just pacing the floor due to the amount of burning. She denies having these symptoms in the past. She denies dysuria. She and her husband used a new lubricant 5 days ago, but otherwise has had no new medications or personal hygiene products. She has a significant past medical history of Parvo, dysautonomia, and thyroid cancer.   Past Medical History:  Diagnosis Date  . Arthritis    rheumatoid  . Asthma   . Atrial flutter ([place])   . Cancer [facility])    THYROID CANCER  . Duodenal ulcer 1986  . Encounter for interrogation of cardiac recorder    Loop recorder  . GERD (gastroesophageal reflux disease)   . Headache(784.0)    migraines  . History of hypotension   . Nonsustained ventricular tachycardia ([place])   . Palpitations    Hx of  . PONV (postoperative nausea and vomiting)   . Postoperative pulmonary embolism ([facility])    Hx of  . Syncope   . 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 TIA  (transient ischemic attack) 2015  . White coat hypertension     Patient Active Problem List   Diagnosis Date Noted  . Thyroid cancer ([place]) 10/22/2012  . HYPERCHOLESTEROLEMIA  IIA 02/26/2009  . DYSAUTONOMIA 02/26/2009  . SYNCOPE AND COLLAPSE 02/26/2009  . PALPITATIONS 02/26/2009    Past Surgical History:  Procedure Laterality Date  . ABDOMINAL HYSTERECTOMY    . CARDIAC ELECTROPHYSIOLOGY MAPPING AND ABLATION    . DIAGNOSTIC LAPAROSCOPY    . LAPAROSCOPY    . LEFT OOPHORECTOMY    . LOOP RECORDER     Implanted 2000, removed 2002  . TONSILLECTOMY    . VULVECTOMY     Partial    Prior to Admission medications   Medication Sig Start Date End Date Taking? Authorizing Provider  acetaminophen (TYLENOL) 500 MG tablet Take 500 mg by mouth every 6 (six) hours as needed for pain.    [provider]  albuterol (PROVENTIL HFA;VENTOLIN HFA) 108 (90 BASE) MCG/ACT inhaler Inhale 1-2 puffs into the lungs every 6 (six) hours as needed for wheezing or shortness of breath.    [provider]  ALPRAZolam Duanne Moron) 0.25 MG tablet Take by mouth. 10/16/14   [provider]  aspirin 81 MG EC tablet Take 81 mg by mouth daily.      [provider]  Calcium Carb-Cholecalciferol (CALCIUM 500 +D) 500-400 MG-UNIT TABS Take 1 tablet by mouth.    [provider]  Calcium Carbonate-Vit D-Min (  CALTRATE PLUS PO) Take 1 tablet by mouth daily.    [provider]  cetirizine HCl (ZYRTEC) 5 MG/5ML SOLN 5 mg daily.    [provider]  ciprofloxacin (CIPRO) 500 MG tablet Take 1 tablet (500 mg total) 2 (two) times daily by mouth. 11/20/16   Elgin Carn B, FNP  Diphenhyd-Hydrocort-Nystatin (FIRST-DUKES MOUTHWASH) SUSP Swish and spit 5 mLs 6 (six) times daily. As needed    [provider]  diphenhydrAMINE (BENADRYL) 25 mg capsule Take by mouth.    [provider]  flecainide (TAMBOCOR) 50 MG tablet Take 50 mg by mouth every 12 (twelve) hours.       [provider]  fluticasone (FLOVENT HFA) 110 MCG/ACT inhaler Inhale into the lungs 2 (two) times daily.    [provider]  gentamicin ointment (GARAMYCIN) 0.1 % Apply 1 application topically 3 (three) times daily.    [provider]  HYDROcodone-acetaminophen (NORCO/VICODIN) 5-325 MG tablet Take 1 tablet every 4 (four) hours as needed by mouth for moderate pain. 11/20/16 11/20/17  Katia Hannen, Johnette Abraham B, FNP  ibuprofen (ADVIL,MOTRIN) 200 MG tablet Take 200 mg by mouth every 6 (six) hours as needed.    [provider]  levothyroxine (SYNTHROID, LEVOTHROID) 112 MCG tablet Take 112 mcg by mouth daily before breakfast.    [provider]  MAGNESIUM GLYCINATE PLUS PO Take 120 mg by mouth daily.    [provider]  midodrine (PROAMATINE) 2.5 MG tablet Take 2.5 mg by mouth 2 (two) times daily with a meal.    [provider]  montelukast (SINGULAIR) 10 MG tablet Take by mouth. 11/20/14 11/20/15  [provider]  nadolol (CORGARD) 20 MG tablet Take by mouth. 06/19/14   [provider]  ondansetron (ZOFRAN-ODT) 4 MG disintegrating tablet Take 1 tablet (4 mg total) every 8 (eight) hours as needed by mouth for nausea or vomiting. 11/20/16   Feliza Diven B, FNP  predniSONE (DELTASONE) 50 MG tablet Take 50 mg by mouth See admin instructions. 1 tab every 6 hours for 3 doses. Take them 13 hours, 7 hours and 1 hour prior to the study    [provider]  vitamin E (VITAMIN E) 400 UNIT capsule Take 400 Units by mouth daily.    [provider]    Allergies Contrast media [iodinated diagnostic agents]; Vancomycin; Amoxicillin-pot clavulanate; Bystolic [nebivolol hcl]; Codeine; Erythromycin; Etanercept; Meperidine hcl; Prozac [fluoxetine hcl]; Septra ds [sulfamethoxazole-trimethoprim]; and Topamax [topiramate]  Family History  Problem Relation Age of Onset  . Heart disease Father   . Diabetes Mother   . Breast cancer  Mother   . Breast cancer Other   . Colon cancer Other     Social History Social History   Tobacco Use  . Smoking status: Never Smoker  . Smokeless tobacco: Never Used  Substance Use Topics  . Alcohol use: No    Alcohol/week: 0.0 oz  . Drug use: No    Review of Systems Constitutional: Negative for fever. Respiratory: Negative for shortness of breath or cough. Gastrointestinal: Negative for abdominal pain; Positive for nausea , negative for vomiting. Genitourinary: Negative for dysuria , negative for vaginal discharge. Positive for vaginal burning. Musculoskeletal: Negative for back pain. Skin: Positive for lesion on right upper lip. ____________________________________________   PHYSICAL EXAM:  VITAL SIGNS: ED Triage Vitals  Enc Vitals Group     BP 11/20/16 0822 (!) 156/90     Pulse Rate 11/20/16 0822 75     Resp 11/20/16 6440  17     Temp 11/20/16 0822 98.7 F (37.1 C)     Temp Source 11/20/16 0822 Oral     SpO2 11/20/16 0822 98 %     Weight 11/20/16 0823 152 lb (68.9 kg)     Height 11/20/16 0823 _0  (1.626 m)     Head Circumference --      Peak Flow --      Pain Score 11/20/16 0822 6     Pain Loc --      Pain Edu? --      Excl. in [place]? --     Constitutional: Alert and oriented. Well appearing and in no acute distress. Eyes: Conjunctivae are normal. PERRL. EOMI. Head: Atraumatic. Nose: No congestion/rhinnorhea. Mouth/Throat: Mucous membranes are moist. Respiratory: Normal respiratory effort.  No retractions. Gastrointestinal: Abdomen is soft and nontender Genitourinary: Pelvic exam: Vaginal walls are erythematous and friable. Scant amount of thin, cream/yellow exudate in vaginal vault. Labia appear normal without erythema or lesions. Perineum intact and without erythema.  Musculoskeletal: No extremity tenderness nor edema.  Neurologic:  Normal speech and language. No gross focal neurologic deficits are appreciated. Speech is normal. No gait  instability. Skin:  Skin is warm, dry and intact. No rash noted. Erythematous vesicle on right upper lip. Psychiatric: Mood and affect are normal. Speech and behavior are normal.  ____________________________________________   LABS (all labs ordered are listed, but only abnormal results are displayed)  Labs Reviewed  WET PREP, GENITAL - Abnormal; Notable for the following components:      Result Value   WBC, Wet Prep HPF POC FEW (*)    All other components within normal limits  URINALYSIS, COMPLETE (UACMP) WITH MICROSCOPIC - Abnormal; Notable for the following components:   Color, Urine STRAW (*)    APPearance CLEAR (*)    Specific Gravity, Urine 1.002 (*)    Hgb urine dipstick MODERATE (*)    Leukocytes, UA LARGE (*)    All other components within normal limits  CBC WITH DIFFERENTIAL/PLATELET - Abnormal; Notable for the following components:   Neutro Abs 7.2 (*)    All other components within normal limits  BASIC METABOLIC PANEL - Abnormal; Notable for the following components:   Glucose, Bld 103 (*)    All other components within normal limits  CK - Abnormal; Notable for the following components:   Total CK 27 (*)    All other components within normal limits   ____________________________________________  RADIOLOGY  Not indicated. ____________________________________________   PROCEDURES  Procedure(s) performed: None  __________________________________________  51 year old female presenting to the emergency department for multiple medical complaints but most concerning is the severity of the vaginal burning she has experienced since approximately 11 PM last night. Due to patient's multiple chronic illnesses and current medical complaints, labs and urinalysis have been requested.  ----------------------------------------- 10:44 AM on 11/20/2016 -----------------------------------------  Discussed with Dr. Kenton Kingfisher who feels that she should follow up in the office. No  further recommendations for treatments or testing today.  ----------------------------------------- 10:50 AM on 11/20/2016 -----------------------------------------  Erythema along the cephalic vein where PIV inserted is erythematous but patient denies symptoms of allergic reaction and no hives are noted. Benadryl ordered. Vaginal burning continues, but is less than upon arrival.  ----------------------------------------- 11:38 AM on 11/20/2016 -----------------------------------------  Erythema along the cephalic vein completely resolved. Patient to be discharged home and will be given prescriptions for ciprofloxacin, Zofran, and Norco. She is strongly advised to schedule an appointment with Dr. Kenton Kingfisher or the gynecologist  of her choice. She was also strongly advised to follow-up with her rheumatologist as her current symptoms may indicate SLE. She was advised to return to the emergency department for symptoms that change or worsen if she is unable to see her primary care or either of the specialists.  INITIAL IMPRESSION / ASSESSMENT AND PLAN / ED COURSE  Pertinent labs & imaging results that were available during my care of the patient were reviewed by me and considered in my medical decision making (see chart for details).  ____________________________________________   FINAL CLINICAL IMPRESSION(S) / ED DIAGNOSES  Final diagnoses:  Vaginal burning  Acute cystitis with hematuria  Myalgia    Note:  This document was prepared using Dragon voice recognition software and may include unintentional dictation errors.    Victorino Dike, FNP 11/21/16 2493    Darel Hong, MD 11/23/16 [phone]

## 2016-11-20 NOTE — ED Notes (Addendum)
Patient in room tearful, c/o "burning sensation in vagina". Clinical presentation confirmed by provider as being "red/inflamed".

## 2016-11-20 NOTE — Discharge Instructions (Signed)
Please schedule appointments with both the gynecologist as well as rheumatology.   Complete pelvic rest until you are evaluated by gynecology.   Return to the ER for symptoms that change or worsen if unable to schedule an appointment.

## 2017-05-20 ENCOUNTER — Emergency Department: Payer: BLUE CROSS/BLUE SHIELD

## 2017-05-20 ENCOUNTER — Encounter: Payer: Self-pay | Admitting: *Deleted

## 2017-05-20 ENCOUNTER — Emergency Department
Admission: EM | Admit: 2017-05-20 | Discharge: 2017-05-20 | Disposition: A | Discharge status: Home / Self Care | Payer: BLUE CROSS/BLUE SHIELD | Attending: Emergency Medicine | Admitting: Emergency Medicine

## 2017-05-20 ENCOUNTER — Other Ambulatory Visit: Payer: Self-pay

## 2017-05-20 DIAGNOSIS — J45909 Unspecified asthma, uncomplicated: Secondary | ICD-10-CM | POA: Insufficient documentation

## 2017-05-20 DIAGNOSIS — Z7982 Long term (current) use of aspirin: Secondary | ICD-10-CM | POA: Diagnosis not present

## 2017-05-20 DIAGNOSIS — M25512 Pain in left shoulder: Secondary | ICD-10-CM | POA: Diagnosis not present

## 2017-05-20 DIAGNOSIS — Z79899 Other long term (current) drug therapy: Secondary | ICD-10-CM | POA: Diagnosis not present

## 2017-05-20 DIAGNOSIS — Z8673 Personal history of transient ischemic attack (TIA), and cerebral infarction without residual deficits: Secondary | ICD-10-CM | POA: Diagnosis not present

## 2017-05-20 DIAGNOSIS — R531 Weakness: Secondary | ICD-10-CM

## 2017-05-20 DIAGNOSIS — R29810 Facial weakness: Secondary | ICD-10-CM | POA: Diagnosis present

## 2017-05-20 DIAGNOSIS — R0789 Other chest pain: Secondary | ICD-10-CM | POA: Insufficient documentation

## 2017-05-20 DIAGNOSIS — Z85850 Personal history of malignant neoplasm of thyroid: Secondary | ICD-10-CM | POA: Insufficient documentation

## 2017-05-20 DIAGNOSIS — M6281 Muscle weakness (generalized): Secondary | ICD-10-CM | POA: Diagnosis not present

## 2017-05-20 LAB — CBC
HEMATOCRIT: 42.8 % (ref 35.0–47.0)
Hemoglobin: 15 g/dL (ref 12.0–16.0)
MCH: 31.5 pg (ref 26.0–34.0)
MCHC: 35 g/dL (ref 32.0–36.0)
MCV: 90 fL (ref 80.0–100.0)
Platelets: 207 10*3/uL (ref 150–440)
RBC: 4.75 MIL/uL (ref 3.80–5.20)
RDW: 13.7 % (ref 11.5–14.5)
WBC: 3.4 10*3/uL — AB (ref 3.6–11.0)

## 2017-05-20 LAB — TROPONIN I: Troponin I: <0.03 ng/mL (ref <0.03)

## 2017-05-20 LAB — COMPREHENSIVE METABOLIC PANEL
ALT: 11 U/L — AB (ref 14–54)
AST: 20 U/L (ref 15–41)
Albumin: 4.4 g/dL (ref 3.5–5.0)
Alkaline Phosphatase: 55 U/L (ref 38–126)
Anion gap: 7 (ref 5–15)
BUN: 18 mg/dL (ref 6–20)
CO2: 28 mmol/L (ref 22–32)
CREATININE: 0.61 mg/dL (ref 0.44–1.00)
Calcium: 8.9 mg/dL (ref 8.9–10.3)
Chloride: 105 mmol/L (ref 101–111)
Glucose, Bld: 164 mg/dL — ABNORMAL HIGH (ref 65–99)
Potassium: 3.6 mmol/L (ref 3.5–5.1)
Sodium: 140 mmol/L (ref 135–145)
Total Bilirubin: 0.7 mg/dL (ref 0.3–1.2)
Total Protein: 6.9 g/dL (ref 6.5–8.1)

## 2017-05-20 LAB — DIFFERENTIAL
Basophils Absolute: 0 10*3/uL (ref 0–0.1)
Basophils Relative: 1 %
Eosinophils Absolute: 0.1 10*3/uL (ref 0–0.7)
Eosinophils Relative: 3 %
LYMPHS PCT: 33 %
Lymphs Abs: 1.1 10*3/uL (ref 1.0–3.6)
MONO ABS: 0.3 10*3/uL (ref 0.2–0.9)
MONOS PCT: 8 %
NEUTROS ABS: 1.9 10*3/uL (ref 1.4–6.5)
Neutrophils Relative %: 55 %

## 2017-05-20 LAB — APTT: APTT: 32 s (ref 24–36)

## 2017-05-20 LAB — PROTIME-INR
INR: 0.94
Prothrombin Time: 12.5 seconds (ref 11.4–15.2)

## 2017-05-20 LAB — TSH: TSH: 0.195 u[IU]/mL — AB (ref 0.350–4.500)

## 2017-05-20 MED ORDER — PREDNISONE 20 MG PO TABS: 60.0000 mg | ORAL_TABLET | Freq: Every day | ORAL | 0 refills | Status: AC

## 2017-05-20 MED ORDER — LORAZEPAM 2 MG/ML IJ SOLN
1.0000 mg | Freq: Once | INTRAMUSCULAR | Status: AC
  Administered 2017-05-20: 1 mg via INTRAVENOUS
  Filled 2017-05-20: qty 1

## 2017-05-20 MED ORDER — VALACYCLOVIR HCL 1 G PO TABS: 2000.0000 mg | ORAL_TABLET | Freq: Three times a day (TID) | ORAL | 0 refills | Status: AC

## 2017-05-20 MED ORDER — ASPIRIN 81 MG PO CHEW
CHEWABLE_TABLET | ORAL | Status: AC
  Filled 2017-05-20: qty 4

## 2017-05-20 MED ORDER — VALACYCLOVIR HCL 500 MG PO TABS
1000.0000 mg | ORAL_TABLET | Freq: Once | ORAL | Status: AC
  Administered 2017-05-20: 1000 mg via ORAL
  Filled 2017-05-20: qty 2

## 2017-05-20 MED ORDER — DIPHENHYDRAMINE HCL 25 MG PO CAPS
50.0000 mg | ORAL_CAPSULE | Freq: Once | ORAL | Status: AC
Start: 2017-05-20 — End: 2017-05-20
  Administered 2017-05-20: 50 mg via ORAL
  Filled 2017-05-20: qty 2

## 2017-05-20 MED ORDER — PREDNISONE 10 MG PO TABS
10.0000 mg | ORAL_TABLET | Freq: Once | ORAL | Status: AC
  Administered 2017-05-20: 10 mg via ORAL
  Filled 2017-05-20: qty 1

## 2017-05-20 MED ORDER — PREDNISONE 20 MG PO TABS
50.0000 mg | ORAL_TABLET | Freq: Four times a day (QID) | ORAL | Status: DC
  Administered 2017-05-20: 50 mg via ORAL
  Filled 2017-05-20: qty 2

## 2017-05-20 NOTE — ED Notes (Signed)
ED Provider at bedside. 

## 2017-05-20 NOTE — ED Notes (Addendum)
Patient transported to MRI with [name], EDT, [name] from MRI called to confirm

## 2017-05-20 NOTE — ED Provider Notes (Addendum)
[facility] Emergency Department Provider Note  ___________________________________________   First MD Initiated Contact with Patient 05/20/17 1459     (approximate)  I have reviewed the triage vital signs and the nursing notes.   HISTORY  Chief Complaint Facial Droop    HPI Priscilla Greene is a 52 y.o. female with a history of rheumatoid arthritis with negative rheumatoid labs as well as ventricular tachycardia and atrial flutter who is presenting to the emergency department today with left-sided facial numbness and the feeling that the left side of her cheek as being "pulled."  She says that around 1245 today she started having difficulty swallowing when she had a fiber shake in order to help with constipation.  She says that about 15 minutes prior to arrival she started to feel a left-sided facial numbness with a sensation that someone is "pulling" on the left side of her face.  She denies any weakness or numbness in her bilateral upper or lower extremities.  Is not on any blood thinners but says that she has a past history of PE, but after surgery.  Also reports that she was at [facility] 2 days ago where she had a cardiac rule out and was discharged home.  She was sent to the emergency department at [facility] because she was having outpatient pulmonary function test and experiencing shortness of breath.  She then, subsequently, had a negative VQ scan.  Was seen by cardiology with reassuring EKGs and troponins not concerning for cardiac ischemia.  She said that she was also experiencing chest pressure earlier today like a band was tightening across her chest with mild radiation to the left shoulder.  However, she denies any pressure at this time.  Also had a cardiac echo without any concerning pathology just 2 days ago.  Patient denies any tick bites and says that she does not go outside frequently.  Past Medical History:  Diagnosis Date  . Arthritis    rheumatoid  . Asthma   . Atrial flutter ([place])   . Cancer [facility])    THYROID CANCER  . Duodenal ulcer 1986  . Encounter for interrogation of cardiac recorder    Loop recorder  . GERD (gastroesophageal reflux disease)   . Headache(784.0)    migraines  . History of hypotension   . Nonsustained ventricular tachycardia ([place])   . Palpitations    Hx of  . PONV (postoperative nausea and vomiting)   . Postoperative pulmonary embolism (H[facility])    Hx of  . Syncope   . 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 TIA (transient ischemic attack) 2015  . White coat hypertension     Patient Active Problem List   Diagnosis Date Noted  . Thyroid cancer ([place]) 10/22/2012  . HYPERCHOLESTEROLEMIA  IIA 02/26/2009  . DYSAUTONOMIA 02/26/2009  . SYNCOPE AND COLLAPSE 02/26/2009  . PALPITATIONS 02/26/2009    Past Surgical History:  Procedure Laterality Date  . ABDOMINAL HYSTERECTOMY    . CARDIAC ELECTROPHYSIOLOGY MAPPING AND ABLATION    . COLONOSCOPY WITH PROPOFOL N/A 09/21/2016   Procedure: COLONOSCOPY WITH PROPOFOL;  Surgeon: Manya Silvas, MD;  Location: [facility] ENDOSCOPY;  Service: Endoscopy;  Laterality: N/A;  . DIAGNOSTIC LAPAROSCOPY    . ESOPHAGOGASTRODUODENOSCOPY (EGD) WITH PROPOFOL N/A 09/21/2016   Procedure: ESOPHAGOGASTRODUODENOSCOPY (EGD) WITH PROPOFOL;  Surgeon: Manya Silvas, MD;  Location: [facility] ENDOSCOPY;  Service: Endoscopy;  Laterality: N/A;  . LAPAROSCOPY    .  LEFT OOPHORECTOMY    . LOOP RECORDER     Implanted 2000, removed 2002  . SAVORY DILATION N/A 09/21/2016   Procedure: SAVORY DILATION;  Surgeon: Manya Silvas, MD;  Location: [facility] ENDOSCOPY;  Service: Endoscopy;  Laterality: N/A;  . THYROID LOBECTOMY Bilateral 10/18/2012   Procedure: total thyroidectomy with limited central compartment lympy node disection;  Surgeon: Earnstine Regal, MD;  Location: [facility] ORS;  Service: General;  Laterality: Bilateral;  . TONSILLECTOMY      . VULVECTOMY     Partial    Prior to Admission medications   Medication Sig Start Date End Date Taking? Authorizing Provider  acetaminophen (TYLENOL) 500 MG tablet Take 500 mg by mouth every 6 (six) hours as needed for pain.   Yes [provider]  albuterol (PROVENTIL HFA;VENTOLIN HFA) 108 (90 BASE) MCG/ACT inhaler Inhale 1-2 puffs into the lungs every 6 (six) hours as needed for wheezing or shortness of breath.   Yes [provider]  ALPRAZolam (XANAX) 0.25 MG tablet Take 0.25 mg by mouth daily as needed for anxiety.  10/16/14  Yes [provider]  aspirin 81 MG EC tablet Take 81 mg by mouth daily.     Yes [provider]  Calcium Carb-Cholecalciferol (CALCIUM 500 +D) 500-400 MG-UNIT TABS Take 1 tablet by mouth.   Yes [provider]  flecainide (TAMBOCOR) 50 MG tablet Take 50 mg by mouth every 12 (twelve) hours.     Yes [provider]  gentamicin ointment (GARAMYCIN) 0.1 % Apply 1 application topically 3 (three) times daily as needed.    Yes [provider]  levothyroxine (SYNTHROID, LEVOTHROID) 112 MCG tablet Take 112 mcg by mouth daily before breakfast.   Yes [provider]  MAGNESIUM GLYCINATE PLUS PO Take 120 mg by mouth daily.   Yes [provider]  midodrine (PROAMATINE) 2.5 MG tablet Take 2.5 mg by mouth 3 (three) times daily with meals.    Yes [provider]  nadolol (CORGARD) 20 MG tablet Take 10 mg by mouth daily.  06/19/14  Yes [provider]  vitamin E (VITAMIN E) 400 UNIT capsule Take 400 Units by mouth daily.   Yes [provider]  ciprofloxacin (CIPRO) 500 MG tablet Take 1 tablet (500 mg total) 2 (two) times daily by mouth. Patient not taking: Reported on 05/20/2017 11/20/16   Sherrie George B, FNP  HYDROcodone-acetaminophen (NORCO/VICODIN) 5-325 MG tablet Take 1 tablet every 4 (four) hours as needed by mouth for moderate pain. Patient not taking: Reported on 05/20/2017  11/20/16 11/20/17  Sherrie George B, FNP  ondansetron (ZOFRAN-ODT) 4 MG disintegrating tablet Take 1 tablet (4 mg total) every 8 (eight) hours as needed by mouth for nausea or vomiting. Patient not taking: Reported on 05/20/2017 11/20/16   Sherrie George B, FNP    Allergies Contrast media [iodinated diagnostic agents]; Vancomycin; Amoxicillin-pot clavulanate; Bystolic [nebivolol hcl]; Codeine; Erythromycin; Etanercept; Meperidine hcl; Prozac [fluoxetine hcl]; Septra ds [sulfamethoxazole-trimethoprim]; and Topamax [topiramate]  Family History  Problem Relation Age of Onset  . Heart disease Father   . Diabetes Mother   . Breast cancer Mother   . Breast cancer Other   . Colon cancer Other     Social History Social History   Tobacco Use  . Smoking status: Never Smoker  . Smokeless tobacco: Never Used  Substance Use Topics  . Alcohol use: No    Alcohol/week: 0.0 oz  . Drug use: No    Review of Systems  Constitutional:  No fever/chills Eyes: No visual changes. ENT: No sore throat. Cardiovascular: As above Respiratory: Denies shortness of breath. Gastrointestinal: No abdominal pain.  No nausea, no vomiting.  No diarrhea.  No constipation. Genitourinary: Negative for dysuria. Musculoskeletal: Negative for back pain. Skin: Negative for rash. Neurological: Negative for headaches   ____________________________________________   PHYSICAL EXAM:  VITAL SIGNS: ED Triage Vitals  Enc Vitals Group     BP 05/20/17 1500 (!) 164/77     Pulse Rate 05/20/17 1455 63     Resp 05/20/17 1455 14     Temp 05/20/17 1516 99 F (37.2 C)     Temp Source 05/20/17 1516 Oral     SpO2 05/20/17 1455 100 %     Weight 05/20/17 1519 147 lb (66.7 kg)     Height 05/20/17 1519 5\' 4"  (1.626 m)     Head Circumference --      Peak Flow --      Pain Score 05/20/17 1518 3     Pain Loc --      Pain Edu? --      Excl. in [place]? --     Constitutional: Alert and oriented. Well appearing and in no acute  distress. Eyes: Conjunctivae are normal.  Head: Atraumatic. Nose: No congestion/rhinnorhea. Mouth/Throat: Mucous membranes are moist.  Neck: No stridor.   Cardiovascular: Normal rate, regular rhythm. Grossly normal heart sounds.  Equal and bilateral radial as well as dorsalis pedis pulses. Respiratory: Normal respiratory effort.  No retractions. Lungs CTAB. Gastrointestinal: Soft and nontender. No distention. No CVA tenderness. Musculoskeletal: No lower extremity tenderness nor edema.  No joint effusions. Neurologic:  Normal speech and language.  Right-sided facial droop.  Left-sided decreased facial sensation to light touch. Skin:  Skin is warm, dry and intact. No rash noted. Psychiatric: Mood and affect are normal. Speech and behavior are normal.  NIH Stroke Scale   Person Administering Scale: Doran Stabler  Administer stroke scale items in the order listed. Record performance in each category after each subscale exam. Do not go back and change scores. Follow directions provided for each exam technique. Scores should reflect what the patient does, not what the clinician thinks the patient can do. The clinician should record answers while administering the exam and work quickly. Except where indicated, the patient should not be coached (i.e., repeated requests to patient to make a special effort).   1a  Level of consciousness: 0=alert; keenly responsive  1b. LOC questions:  0=Performs both tasks correctly  1c. LOC commands: 0=Performs both tasks correctly  2.  Best Gaze: 0=normal  3.  Visual: 0=No visual loss  4. Facial Palsy: 2=Partial paralysis (total or near total paralysis of the lower face)  5a.  Motor left arm: 1=Drift, limb holds 90 (or 45) degrees but drifts down before full 10 seconds: does not hit bed  5b.  Motor right arm: 0=No drift, limb holds 90 (or 45) degrees for full 10 seconds  6a. motor left leg: 1=Drift, limb holds 90 (or 45) degrees but drifts down before full  10 seconds: does not hit bed  6b  Motor right leg:  0=No drift, limb holds 90 (or 45) degrees for full 10 seconds  7. Limb Ataxia: 0=Absent  8.  Sensory: 1=Mild to moderate sensory loss; patient feels pinprick is less sharp or is dull on the affected side; there is a loss of superficial pain with pinprick but patient is aware She is being touched  9. Best Language:  0=No  aphasia, normal  10. Dysarthria: 0=Normal  11. Extinction and Inattention: 0=No abnormality  12. Distal motor function: 0=Normal   Total:   5   ____________________________________________   LABS (all labs ordered are listed, but only abnormal results are displayed)  Labs Reviewed  CBC - Abnormal; Notable for the following components:      Result Value   WBC 3.4 (*)    All other components within normal limits  COMPREHENSIVE METABOLIC PANEL - Abnormal; Notable for the following components:   Glucose, Bld 164 (*)    ALT 11 (*)    All other components within normal limits  TSH - Abnormal; Notable for the following components:   TSH 0.195 (*)    All other components within normal limits  PROTIME-INR  APTT  DIFFERENTIAL  TROPONIN I  CBG MONITORING, ED  POC URINE PREG, ED   ____________________________________________  EKG  ED ECG REPORT I, Doran Stabler, the attending physician, personally viewed and interpreted this ECG.   Date: 05/20/2017  EKG Time: 1454  Rate: 60  Rhythm: normal sinus rhythm  Axis: Normal  Intervals:none  ST&T Change: No ST segment elevation or depression.  No abnormal T wave inversion.  ED ECG REPORT I, Doran Stabler, the attending physician, personally viewed and interpreted this ECG.   Date: 05/20/2017  EKG Time: 1522  Rate: 67  Rhythm: normal sinus rhythm  Axis: Normal  Intervals:none  ST&T Change: No ST segment elevation or depression.  No abnormal T wave inversion.  ____________________________________________  RADIOLOGY  No acute finding on the CT  noncontrast of the brain.  No acute findings on the MR angiography of the head and neck or chest. ____________________________________________   PROCEDURES  Procedure(s) performed:   Procedures  Critical Care performed:   ____________________________________________   INITIAL IMPRESSION / ASSESSMENT AND PLAN / ED COURSE  Pertinent labs & imaging results that were available during my care of the patient were reviewed by me and considered in my medical decision making (see chart for details).  Differential diagnosis includes, but is not limited to, alcohol, illicit or prescription medications, or other toxic ingestion; intracranial pathology such as stroke or intracerebral hemorrhage; fever or infectious causes including sepsis; hypoxemia and/or hypercarbia; uremia; trauma; endocrine related disorders such as diabetes, hypoglycemia, and thyroid-related diseases; hypertensive encephalopathy; etc. As part of my medical decision making, I reviewed the following data within the electronic MEDICAL RECORD NUMBER Notes from prior ED visits, echocardiograms as well as recent cardiology consult  ----------------------------------------- 4:10 PM on 05/20/2017 -----------------------------------------  Patient evaluated by the specialist on-call neurologist who says that he cannot pinpoint the exact time that the patient began to have left-sided upper extremity drift and left-sided weakness.  Says that she is not a TPA candidate.  Concerning for recent chest pressure associated with neurologic symptoms.  Patient with anaphylactic reaction to contrast media.  I discussed the case Dr. Posey Pronto of radiology regarding and MR angiography of the chest.  He says that this may reveal if there is an aortic dissection.  However, he says that the ideal test at [facility], secondary to the scanner being an older model, is a CT angiography and to start the prep for the patient.  Per the radiology technologist the patient  must undergo a 13-hour prep because of her anaphylactic reaction to contrast.  I will be holding aspirin because of the concern for dissection.  Patient will require admission to the hospital for neurology consult and possibly further work-up of her  neurologic symptoms.  Patient as well as husband are understanding of the plan willing to comply.  ----------------------------------------- 10:04 PM on 05/20/2017 -----------------------------------------  Patient at this time still with a right-sided facial droop.  I had her close her eyes tightly and there is no weakness of the eyelids.  The forehead is symmetrically wrinkled on both sides.  Patient still with left-sided upper and lower extremity weakness.  However, the patient is unsure when this began exactly.  I offered her admission but she says that she would rather go home.  I also discussed the case with Dr. Doy Hutching for admission and he reviewed the patient's records and says that the patient does not merit admission if her MRIs are negative.  He reviewed her chart and thought this may have been more of a psychiatric issue because of the history of conversion disorder.  Patient also without exam consistent with Bell's palsy.  However, she does have definitive nasolabial flattening on the right side.  I will treat her for Bell's to cover this condition and she will follow-up with neurology.  She is requesting to be discharged home at this time.  She will return for any worsening or concerning symptoms.  She is understanding of the treatment plan and willing to comply. ____________________________________________   FINAL CLINICAL IMPRESSION(S) / ED DIAGNOSES  Right-sided facial droop.  Left-sided weakness.    NEW MEDICATIONS STARTED DURING THIS VISIT:  New Prescriptions   No medications on file     Note:  This document was prepared using Dragon voice recognition software and may include unintentional dictation errors.     Orbie Pyo, MD 05/20/17 2205    Orbie Pyo, MD 05/20/17 2207

## 2017-05-20 NOTE — Consult Note (Signed)
[facility] TeleNeurology Consult Services  Asked to see this patient in telemedicine consultation. Consultation was performed with assistance of ancillary/medical staff at bedside.  Comments: Last Known normal  Unclear Door Time:1434 [facility] Contacted: 2725 [facility] first log in: 1524 NIHSS assessment time: 3664 Call back time: 1543 Needle Time: no iv tpa  HPI:  46 yof with hx of near syncope, multiple drug allergies presents to hospital with left face numbness and tongue numbness.  She denies prior hx of bells palsy or stroke but has obvious R NLF but no dysarthria.  She denies any weakness or extremity numbness but has slight drift in her left arm and leg, which she can't timeline.  She states it is possibly weaker but more obvious to her now during evaluation in ED.  The complaints of numbness started around 1230 but she can't clarify the R facial droop or left sided weakness.   VSS  Gen Wn/Wd in Nad  TeleStroke Assessment: LOC:   0 LOC questions:  0 LOC Commands :   0 Gaze : 0 Visual fields :  0  Facial movements : 1 Upper limb Motor  1 Lower limb Motor  1 Limb Coordination  - 0 Sensory -  -1 Language -  0 Speech -   0 Neglect / extinction -  0  NIHSS Score: 4    IMPRESSION  Concern for small stroke possibly subacute by hx  Medical Decision Making:   Patient is not candidate for alteplase due to non focal / localizing exam and unclear onset of all sxs.  Not an IR candidate as low clinical suspicion for LVO by neurologic assessment.   Recommendations: - Daily antithrombotics to initiate now if no contraindication.  - Further work up with Stroke labs, MRI brain, MRA head,  NIVS Carotid will be deferred to inpt neurology service -  Needs Inpatient Neurology consultation and follow up  - Thank you for allowing [place] to participate in the care of your patient, if there are any questions please don't hesitate to contact us  Discussed plan of care  with patient/fhospital staff   Physician: Sylvan Cheese, DO   [facility]

## 2017-05-20 NOTE — ED Notes (Signed)
Pt requests to take Flecainide, EDP consents, pt to toilet att

## 2017-05-20 NOTE — ED Notes (Signed)
Pt back to room att

## 2017-05-20 NOTE — ED Triage Notes (Addendum)
Left facial numbness, dysphagia, and right facial droop.  Onset of symptoms with dysphagia 1245 and facial numbness started at 1415.

## 2017-05-20 NOTE — ED Triage Notes (Signed)
Pt present to ED via POV w/ R sided facial droop and left sided numbness. Pt has no other neurological deficits.

## 2017-10-09 ENCOUNTER — Other Ambulatory Visit: Payer: Self-pay | Admitting: Internal Medicine

## 2017-10-09 DIAGNOSIS — Z1231 Encounter for screening mammogram for malignant neoplasm of breast: Secondary | ICD-10-CM

## 2017-11-27 ENCOUNTER — Ambulatory Visit
Admission: RE | Admit: 2017-11-27 | Discharge: 2017-11-27 | Disposition: A | Discharge status: Home / Self Care | Payer: BLUE CROSS/BLUE SHIELD | Source: Ambulatory Visit | Attending: Internal Medicine | Admitting: Internal Medicine

## 2017-11-27 DIAGNOSIS — Z1231 Encounter for screening mammogram for malignant neoplasm of breast: Secondary | ICD-10-CM

## 2018-01-28 ENCOUNTER — Other Ambulatory Visit: Payer: Self-pay

## 2018-01-28 ENCOUNTER — Emergency Department
Admission: EM | Admit: 2018-01-28 | Discharge: 2018-01-28 | Disposition: A | Discharge status: Home / Self Care | Payer: BLUE CROSS/BLUE SHIELD | Attending: Emergency Medicine | Admitting: Emergency Medicine

## 2018-01-28 DIAGNOSIS — Z7982 Long term (current) use of aspirin: Secondary | ICD-10-CM | POA: Diagnosis not present

## 2018-01-28 DIAGNOSIS — R55 Syncope and collapse: Secondary | ICD-10-CM

## 2018-01-28 DIAGNOSIS — J45909 Unspecified asthma, uncomplicated: Secondary | ICD-10-CM | POA: Insufficient documentation

## 2018-01-28 DIAGNOSIS — Z79899 Other long term (current) drug therapy: Secondary | ICD-10-CM | POA: Insufficient documentation

## 2018-01-28 LAB — URINALYSIS, COMPLETE (UACMP) WITH MICROSCOPIC
BACTERIA UA: NONE SEEN
Bilirubin Urine: NEGATIVE
Glucose, UA: NEGATIVE mg/dL
Ketones, ur: 5 mg/dL — AB
Leukocytes, UA: NEGATIVE
Nitrite: NEGATIVE
PROTEIN: NEGATIVE mg/dL
SPECIFIC GRAVITY, URINE: 1.004 — AB (ref 1.005–1.030)
pH: 6 (ref 5.0–8.0)

## 2018-01-28 LAB — BASIC METABOLIC PANEL
Anion gap: 6 (ref 5–15)
BUN: 20 mg/dL (ref 6–20)
CHLORIDE: 104 mmol/L (ref 98–111)
CO2: 27 mmol/L (ref 22–32)
CREATININE: 0.6 mg/dL (ref 0.44–1.00)
Calcium: 8.8 mg/dL — ABNORMAL LOW (ref 8.9–10.3)
GFR calc Af Amer: >60 mL/min (ref >60)
GFR calc non Af Amer: >60 mL/min (ref >60)
GLUCOSE: 101 mg/dL — AB (ref 70–99)
POTASSIUM: 3.4 mmol/L — AB (ref 3.5–5.1)
SODIUM: 137 mmol/L (ref 135–145)

## 2018-01-28 LAB — GLUCOSE, CAPILLARY: Glucose-Capillary: 89 mg/dL (ref 70–99)

## 2018-01-28 LAB — CBC
HEMATOCRIT: 45.5 % (ref 36.0–46.0)
HEMOGLOBIN: 15 g/dL (ref 12.0–15.0)
MCH: 29.8 pg (ref 26.0–34.0)
MCHC: 33 g/dL (ref 30.0–36.0)
MCV: 90.5 fL (ref 80.0–100.0)
Platelets: 219 10*3/uL (ref 150–400)
RBC: 5.03 MIL/uL (ref 3.87–5.11)
RDW: 12.8 % (ref 11.5–15.5)
WBC: 4.2 10*3/uL (ref 4.0–10.5)
nRBC: 0 % (ref 0.0–0.2)

## 2018-01-28 MED ORDER — SODIUM CHLORIDE 0.9% FLUSH: 3.0000 mL | Freq: Once | INTRAVENOUS | Status: DC

## 2018-01-28 NOTE — Discharge Instructions (Addendum)
Please seek medical attention for any high fevers, chest pain, shortness of breath, change in behavior, persistent vomiting, bloody stool or any other new or concerning symptoms.  

## 2018-01-28 NOTE — ED Notes (Signed)
Sent rainbow to lab. 

## 2018-01-28 NOTE — ED Triage Notes (Signed)
Pt had a syncopal episode at church lasting approx 40 sec, pt states that she also passed out yesterday. Pt is currently being treated for a boil on her back with doxycycline. Pt reports a hx of bradycardia and thyroid ca and few years ago. Pt reports bells palsy back in may and occasionally it acts up and causes her right upper lip occasionally is affected. Pt states that when she came to and realized 911 had been called she was upset states that this happens, this is just part of her life and she deals with it, but people at church made a big deal about it so her husband called 911

## 2018-01-28 NOTE — ED Provider Notes (Signed)
[facility] Emergency Department Provider Note   ____________________________________________   I have reviewed the triage vital signs and the nursing notes.   HISTORY  Chief Complaint Loss of Consciousness   History limited by: Not Limited   HPI Priscilla Greene is a 53 y.o. female who presents to the emergency department today after having a syncopal episode. The patient stated that she does black out frequently. Is being worked up for this through [employer]. This morning when she got up she was already feeling weak. States it has been a stressful week. When at church she started feeling like she was going to pass out. She was able to get herself down onto the ground. The last time it was this significant was around [place] time. The patient states that she did not feel any abnormal rhythm in her heart.   Per medical record review patient has a history of syncope, palpitations.   Past Medical History:  Diagnosis Date  . Arthritis    rheumatoid  . Asthma   . Atrial flutter ([place])   . Cancer [facility])    THYROID CANCER  . Duodenal ulcer 1986  . Encounter for interrogation of cardiac recorder    Loop recorder  . GERD (gastroesophageal reflux disease)   . Headache(784.0)    migraines  . History of hypotension   . Nonsustained ventricular tachycardia ([place])   . Palpitations    Hx of  . PONV (postoperative nausea and vomiting)   . Postoperative pulmonary embolism (HCC)    Hx of  . Syncope   . 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 TIA (transient ischemic attack) 2015  . White coat hypertension     Patient Active Problem List   Diagnosis Date Noted  . Thyroid cancer ([place]) 10/22/2012  . HYPERCHOLESTEROLEMIA  IIA 02/26/2009  . DYSAUTONOMIA 02/26/2009  . SYNCOPE AND COLLAPSE 02/26/2009  . PALPITATIONS 02/26/2009    Past Surgical History:  Procedure Laterality Date  . ABDOMINAL  HYSTERECTOMY    . CARDIAC ELECTROPHYSIOLOGY MAPPING AND ABLATION    . COLONOSCOPY WITH PROPOFOL N/A 09/21/2016   Procedure: COLONOSCOPY WITH PROPOFOL;  Surgeon: Manya Silvas, MD;  Location: [facility] ENDOSCOPY;  Service: Endoscopy;  Laterality: N/A;  . DIAGNOSTIC LAPAROSCOPY    . ESOPHAGOGASTRODUODENOSCOPY (EGD) WITH PROPOFOL N/A 09/21/2016   Procedure: ESOPHAGOGASTRODUODENOSCOPY (EGD) WITH PROPOFOL;  Surgeon: Manya Silvas, MD;  Location: [facility] ENDOSCOPY;  Service: Endoscopy;  Laterality: N/A;  . LAPAROSCOPY    . LEFT OOPHORECTOMY    . LOOP RECORDER     Implanted 2000, removed 2002  . SAVORY DILATION N/A 09/21/2016   Procedure: SAVORY DILATION;  Surgeon: Manya Silvas, MD;  Location: [facility] ENDOSCOPY;  Service: Endoscopy;  Laterality: N/A;  . THYROID LOBECTOMY Bilateral 10/18/2012   Procedure: total thyroidectomy with limited central compartment lympy node disection;  Surgeon: Earnstine Regal, MD;  Location: [facility] ORS;  Service: General;  Laterality: Bilateral;  . TONSILLECTOMY    . VULVECTOMY     Partial    Prior to Admission medications   Medication Sig Start Date End Date Taking? Authorizing Provider  acetaminophen (TYLENOL) 500 MG tablet Take 500 mg by mouth every 6 (six) hours as needed for pain.    [provider]  albuterol (PROVENTIL HFA;VENTOLIN HFA) 108 (90 BASE) MCG/ACT inhaler Inhale 1-2 puffs into the lungs every 6 (six) hours as needed for wheezing or shortness of breath.  [provider]  ALPRAZolam Duanne Moron) 0.25 MG tablet Take 0.25 mg by mouth daily as needed for anxiety.  10/16/14   [provider]  aspirin 81 MG EC tablet Take 81 mg by mouth daily.      [provider]  Calcium Carb-Cholecalciferol (CALCIUM 500 +D) 500-400 MG-UNIT TABS Take 1 tablet by mouth.    [provider]  ciprofloxacin (CIPRO) 500 MG tablet Take 1 tablet (500 mg total) 2 (two) times daily by mouth. Patient not taking: Reported on 05/20/2017 11/20/16    Sherrie George B, FNP  flecainide (TAMBOCOR) 50 MG tablet Take 50 mg by mouth every 12 (twelve) hours.      [provider]  gentamicin ointment (GARAMYCIN) 0.1 % Apply 1 application topically 3 (three) times daily as needed.     [provider]  levothyroxine (SYNTHROID, LEVOTHROID) 112 MCG tablet Take 112 mcg by mouth daily before breakfast.    [provider]  MAGNESIUM GLYCINATE PLUS PO Take 120 mg by mouth daily.    [provider]  midodrine (PROAMATINE) 2.5 MG tablet Take 2.5 mg by mouth 3 (three) times daily with meals.     [provider]  nadolol (CORGARD) 20 MG tablet Take 10 mg by mouth daily.  06/19/14   [provider]  ondansetron (ZOFRAN-ODT) 4 MG disintegrating tablet Take 1 tablet (4 mg total) every 8 (eight) hours as needed by mouth for nausea or vomiting. Patient not taking: Reported on 05/20/2017 11/20/16   Sherrie George B, FNP  vitamin E (VITAMIN E) 400 UNIT capsule Take 400 Units by mouth daily.    [provider]    Allergies Contrast media [iodinated diagnostic agents]; Vancomycin; Amoxicillin-pot clavulanate; Bystolic [nebivolol hcl]; Codeine; Erythromycin; Etanercept; Meperidine hcl; Prozac [fluoxetine hcl]; Septra ds [sulfamethoxazole-trimethoprim]; and Topamax [topiramate]  Family History  Problem Relation Age of Onset  . Heart disease Father   . Diabetes Mother   . Breast cancer Mother 53  . Breast cancer Other   . Colon cancer Other     Social History Social History   Tobacco Use  . Smoking status: Never Smoker  . Smokeless tobacco: Never Used  Substance Use Topics  . Alcohol use: No    Alcohol/week: 0.0 standard drinks  . Drug use: No    Review of Systems Constitutional: No fever/chills Eyes: No visual changes. ENT: No sore throat. Cardiovascular: Denies chest pain. Respiratory: Denies shortness of breath. Gastrointestinal: No abdominal pain.  No nausea, no vomiting.  No diarrhea.    Genitourinary: Negative for dysuria. Musculoskeletal: Negative for back pain. Skin: Negative for rash. Neurological: Positive for syncopal episode.  ____________________________________________   PHYSICAL EXAM:  VITAL SIGNS: ED Triage Vitals  Enc Vitals Group     BP 01/28/18 1226 (!) 149/62     Pulse Rate 01/28/18 1226 67     Resp 01/28/18 1226 16     Temp 01/28/18 1226 98.1 F (36.7 C)     Temp Source 01/28/18 1226 Oral     SpO2 01/28/18 1226 100 %     Weight 01/28/18 1227 165 lb (74.8 kg)     Height 01/28/18 1227 5\' 4"  (1.626 m)     Head Circumference --      Peak Flow --      Pain Score 01/28/18 1227 0   Constitutional: Alert and oriented.  Eyes: Conjunctivae are normal.  ENT      Head: Normocephalic and atraumatic.      Nose: No  congestion/rhinnorhea.      Mouth/Throat: Mucous membranes are moist.      Neck: No stridor. Hematological/Lymphatic/Immunilogical: No cervical lymphadenopathy. Cardiovascular: Bradycardic, regular rhythm.  No murmurs, rubs, or gallops.  Respiratory: Normal respiratory effort without tachypnea nor retractions. Breath sounds are clear and equal bilaterally. No wheezes/rales/rhonchi. Gastrointestinal: Soft and non tender. No rebound. No guarding.  Genitourinary: Deferred Musculoskeletal: Normal range of motion in all extremities.  Neurologic:  Normal speech and language. No gross focal neurologic deficits are appreciated.  Skin:  Skin is warm, dry and intact. No rash noted. Psychiatric: Mood and affect are normal. Speech and behavior are normal. Patient exhibits appropriate insight and judgment.  ____________________________________________    LABS (pertinent positives/negatives)  CBC wbc 4.2, hgb 15.0, plt 219 BMP wnl except k 3.4, glu 101, ca 8.8 UA clear, small, ketones, 0-5 rbc and wbc ____________________________________________   EKG  I, Nance Pear, attending physician, personally viewed and interpreted this EKG  EKG  Time: 1224 Rate: 56 Rhythm: sinus bradycardia Axis: normal Intervals: qtc 409 QRS: narrow ST changes: no st elevation Impression: abnormal ekg   ____________________________________________    RADIOLOGY  None  ____________________________________________   PROCEDURES  Procedures  ____________________________________________   INITIAL IMPRESSION / ASSESSMENT AND PLAN / ED COURSE  Pertinent labs & imaging results that were available during my care of the patient were reviewed by me and considered in my medical decision making (see chart for details).   Patient presented to the emergency department today because of concern for syncope. Has had this happen a number of times in the past and is being worked up at [employer]. Has had a very stressful and taxing week. Work up here without any concerning findings. At this point I think likely could be combination of stress and underlying disorder that caused the patient's episode. She was observed in the emergency department without any worsening of condition. Discussed returning for any new symptoms and importance of continued follow up.  ____________________________________________   FINAL CLINICAL IMPRESSION(S) / ED DIAGNOSES  Final diagnoses:  Syncope, unspecified syncope type     Note: This dictation was prepared with Dragon dictation. Any transcriptional errors that result from this process are unintentional     Nance Pear, MD 01/28/18 1439

## 2018-10-15 ENCOUNTER — Other Ambulatory Visit: Payer: Self-pay | Admitting: Internal Medicine

## 2018-10-15 DIAGNOSIS — Z1231 Encounter for screening mammogram for malignant neoplasm of breast: Secondary | ICD-10-CM

## 2018-11-29 ENCOUNTER — Ambulatory Visit
Admission: RE | Admit: 2018-11-29 | Discharge: 2018-11-29 | Disposition: A | Discharge status: Home / Self Care | Payer: Medicare Other | Source: Ambulatory Visit | Attending: Internal Medicine | Admitting: Internal Medicine

## 2018-11-29 DIAGNOSIS — Z1231 Encounter for screening mammogram for malignant neoplasm of breast: Secondary | ICD-10-CM | POA: Insufficient documentation

## 2019-02-05 ENCOUNTER — Ambulatory Visit: Payer: BC Managed Care – PPO | Attending: Internal Medicine

## 2019-02-05 DIAGNOSIS — Z20822 Contact with and (suspected) exposure to covid-19: Secondary | ICD-10-CM

## 2019-02-06 LAB — NOVEL CORONAVIRUS, NAA: SARS-CoV-2, NAA: NOT DETECTED

## 2019-04-15 IMAGING — MR MR MRA CHEST W/ OR W/O CM
9 series · 16 of 16 positions shown · non-contrast
Comparison: CT chest 02/15/2013

CLINICAL DATA: Chest pain.  Rule out aortic dissection.

EXAM:
MRA CHEST WITHOUT CONTRAST
TECHNIQUE: Angiographic images of the chest were obtained using MRA technique
without intravenous contrast.

[Series 4: T2 fat-sat · coronal · 8.0mm · 0.78mm/px · 1 of 19 slices shown (1 of 3)]
[im 1/19]
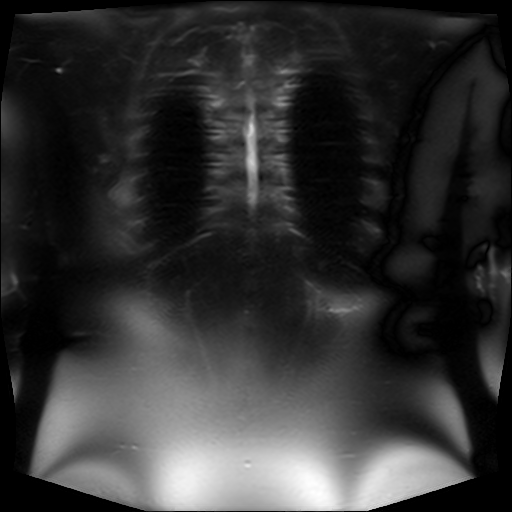

[Series 5: T2 fat-sat · coronal · 8.0mm · 0.78mm/px · 1 of 19 slices shown (2 of 3)]
[im 1/19]
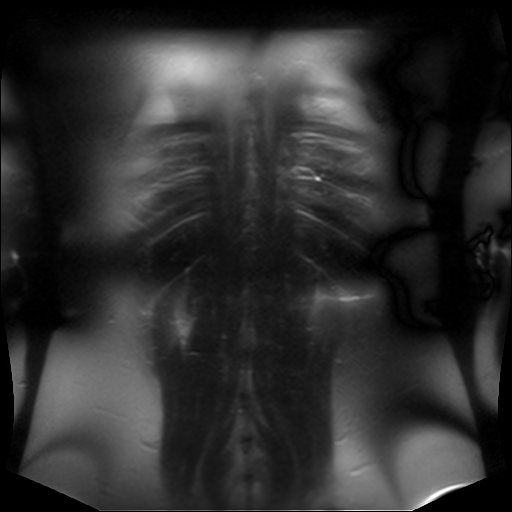

[Series 6: T2 fat-sat · axial · 8.0mm · 0.74mm/px · 1 of 28 slices shown (3 of 3)]
[im 1/28]
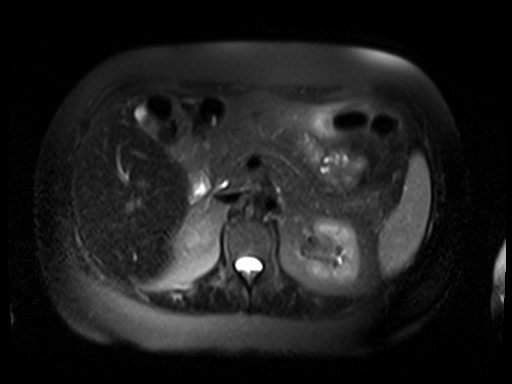

[Series 7: T2 · axial · 8.0mm · 1.27mm/px · z∈[-98,+161]mm · 2 of 28 slices shown (1 of 2)]
[im 1/28]
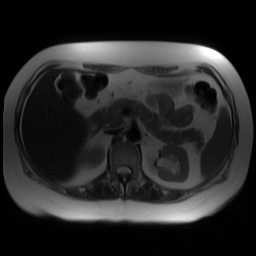
[im 28/28]
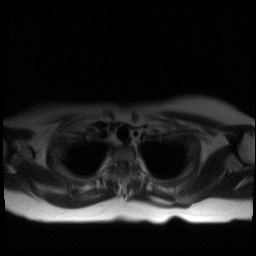

[Series 8: T2 · sagittal · 6.0mm · 0.78mm/px · 1 of 19 slices shown (2 of 2)]
[im 1/19]
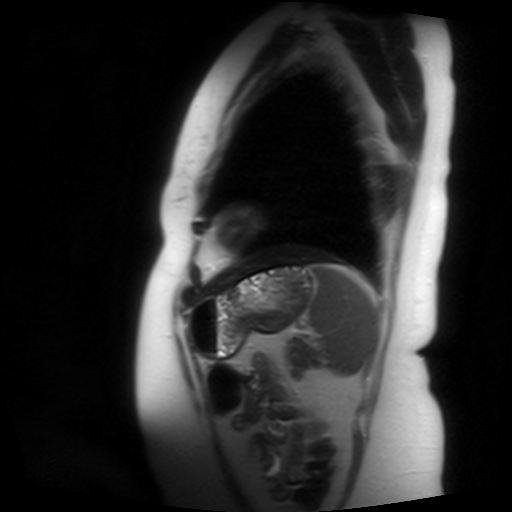

[Series 9: t2_trufi_sag candy cane · sagittal · 6.0mm · 1.27mm/px · 1 of 19 slices shown]
[im 1/19]
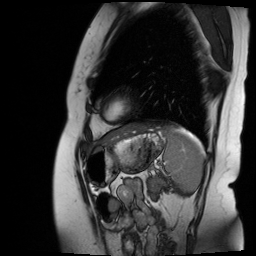

[Series 10: t1_tfl_ax_mbh dark blood · axial · 8.0mm · 0.74mm/px · z∈[-99,+162]mm · 2 of 28 slices shown]
[im 1/28]
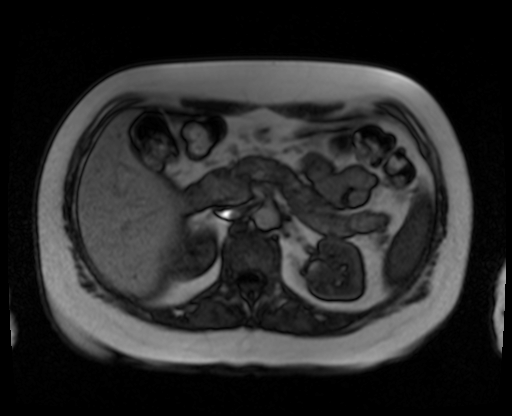
[im 28/28]
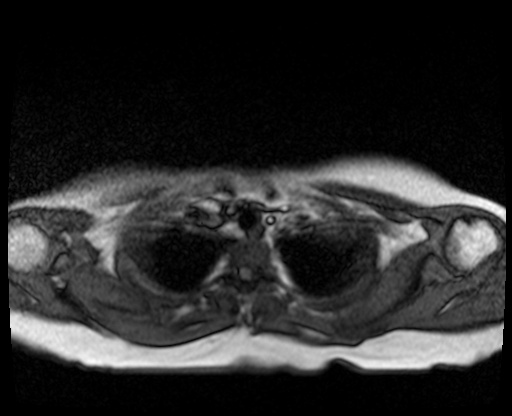

[Series 11: fl3d_candy cane mra_tt=1.0s · sagittal · 1.2mm · 1.09mm/px · 4 of 64 slices shown]
[im 1/64]
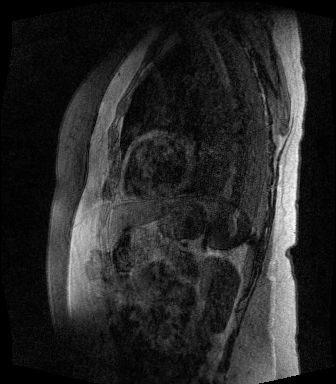
[im 22/64]
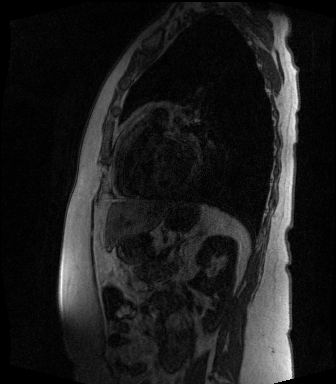
[im 43/64]
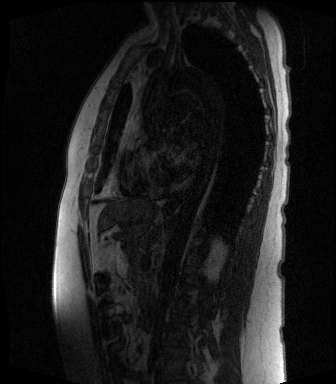
[im 64/64]
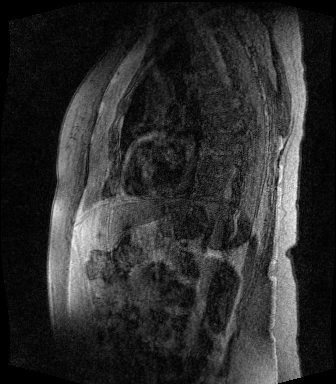

[Series 12: (id) · sagittal · 1.0mm · 0.83mm/px · 3 of 51 slices shown]
[im 1/51]
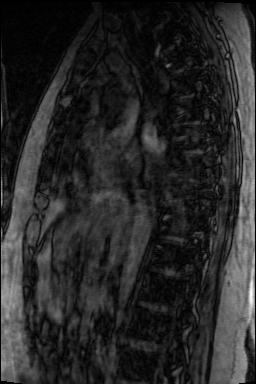
[im 26/51]
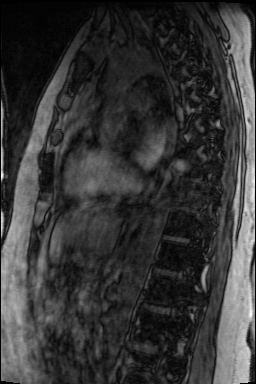
[im 51/51]
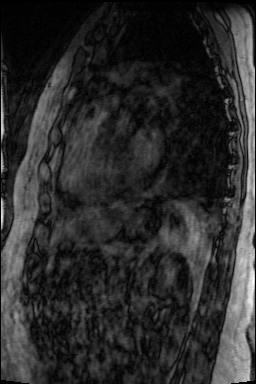

[16 of 16 positions shown; findings below may reference images not displayed]

FINDINGS: VASCULAR

Aorta: The normal in caliber. No evidence for thoracic aortic
aneurysm. No evidence for aortic dissection.

Heart: Heart size appears normal. No pericardial effusion
identified. The

Pulmonary Arteries: The main pulmonary artery has a normal caliber.
Exam not optimized for evaluation of pulmonary embolus.

Other: None

NON-VASCULAR

Spinal cord: Normal.

Brachial plexus: Exam not optimized for evaluating the brachial
plexus.

Muscles and tendons: Exam not optimized for evaluating the muscles
or tendons.

Bones: Negative

Joints: Not optimized for evaluating the joints.

Other: Multiple T2 hyperintense structures within the breast tissue.
Correlation with breast cancer screening.
IMPRESSION: VASCULAR

Normal caliber thoracic aorta without evidence for dissection.

NON-VASCULAR

No gross abnormality.

Bilateral breast cystic lesions. Correlation with breast cancer
screening.

## 2020-09-29 ENCOUNTER — Encounter: Payer: Self-pay | Admitting: Otolaryngology

## 2020-10-07 ENCOUNTER — Ambulatory Visit: Admit: 2020-10-07 | Payer: BC Managed Care – PPO | Admitting: Otolaryngology

## 2020-10-07 ENCOUNTER — Ambulatory Visit: Payer: BC Managed Care – PPO | Admitting: Anesthesiology

## 2020-10-07 ENCOUNTER — Encounter
Admission: RE | Disposition: A | Discharge status: Home / Self Care | Payer: Self-pay | Source: Ambulatory Visit | Attending: Otolaryngology

## 2020-10-07 ENCOUNTER — Ambulatory Visit
Admission: RE | Admit: 2020-10-07 | Discharge: 2020-10-07 | Disposition: A | Discharge status: Home / Self Care | Payer: BC Managed Care – PPO | Source: Ambulatory Visit | Attending: Otolaryngology | Admitting: Otolaryngology

## 2020-10-07 ENCOUNTER — Other Ambulatory Visit: Payer: Self-pay

## 2020-10-07 ENCOUNTER — Encounter: Payer: Self-pay | Admitting: Otolaryngology

## 2020-10-07 DIAGNOSIS — K136 Irritative hyperplasia of oral mucosa: Secondary | ICD-10-CM | POA: Diagnosis not present

## 2020-10-07 DIAGNOSIS — J45909 Unspecified asthma, uncomplicated: Secondary | ICD-10-CM | POA: Insufficient documentation

## 2020-10-07 DIAGNOSIS — E890 Postprocedural hypothyroidism: Secondary | ICD-10-CM | POA: Diagnosis not present

## 2020-10-07 DIAGNOSIS — I10 Essential (primary) hypertension: Secondary | ICD-10-CM | POA: Insufficient documentation

## 2020-10-07 HISTORY — DX: Presence of spectacles and contact lenses: Z97.3

## 2020-10-07 HISTORY — PX: MASS EXCISION: SHX2000

## 2020-10-07 HISTORY — DX: Family history of other specified conditions: Z84.89

## 2020-10-07 HISTORY — DX: Presence of dental prosthetic device (complete) (partial): Z97.2

## 2020-10-07 HISTORY — DX: Hypothyroidism, unspecified: E03.9

## 2020-10-07 HISTORY — DX: Motion sickness, initial encounter: T75.3XXA

## 2020-10-07 SURGERY — EXCISION MASS
Anesthesia: General

## 2020-10-07 MED ORDER — ONDANSETRON HCL 4 MG/2ML IJ SOLN
INTRAMUSCULAR | Status: DC | PRN
  Administered 2020-10-07: 4 mg via INTRAVENOUS

## 2020-10-07 MED ORDER — FENTANYL CITRATE (PF) 100 MCG/2ML IJ SOLN
INTRAMUSCULAR | Status: DC | PRN
  Administered 2020-10-07: 50 ug via INTRAVENOUS

## 2020-10-07 MED ORDER — PROPOFOL 500 MG/50ML IV EMUL
INTRAVENOUS | Status: DC | PRN
  Administered 2020-10-07: 100 ug/kg/min via INTRAVENOUS

## 2020-10-07 MED ORDER — ONDANSETRON HCL 4 MG/2ML IJ SOLN
4.0000 mg | Freq: Once | INTRAMUSCULAR | Status: AC | PRN
  Administered 2020-10-07: 4 mg via INTRAVENOUS

## 2020-10-07 MED ORDER — FENTANYL CITRATE (PF) 100 MCG/2ML IJ SOLN
INTRAMUSCULAR | Status: DC | PRN
  Administered 2020-10-07: 100 ug via INTRAVENOUS

## 2020-10-07 MED ORDER — LACTATED RINGERS IV SOLN: INTRAVENOUS | Status: DC

## 2020-10-07 MED ORDER — LIDOCAINE HCL (CARDIAC) PF 100 MG/5ML IV SOSY
PREFILLED_SYRINGE | INTRAVENOUS | Status: DC | PRN
  Administered 2020-10-07: 50 mg via INTRAVENOUS

## 2020-10-07 MED ORDER — PROPOFOL 10 MG/ML IV BOLUS
INTRAVENOUS | Status: DC | PRN
  Administered 2020-10-07: 200 mg via INTRAVENOUS

## 2020-10-07 MED ORDER — SUCCINYLCHOLINE CHLORIDE 200 MG/10ML IV SOSY
PREFILLED_SYRINGE | INTRAVENOUS | Status: DC | PRN
  Administered 2020-10-07: 100 mg via INTRAVENOUS

## 2020-10-07 MED ORDER — ACETAMINOPHEN 10 MG/ML IV SOLN
1000.0000 mg | Freq: Once | INTRAVENOUS | Status: AC
  Administered 2020-10-07: 1000 mg via INTRAVENOUS

## 2020-10-07 MED ORDER — LIDOCAINE VISCOUS HCL 2 % MT SOLN: 10.0000 mL | Freq: Four times a day (QID) | OROMUCOSAL | 0 refills | Status: DC | PRN

## 2020-10-07 MED ORDER — LIDOCAINE-EPINEPHRINE 1 %-1:100000 IJ SOLN
INTRAMUSCULAR | Status: DC | PRN
  Administered 2020-10-07: 1 mL

## 2020-10-07 MED ORDER — CHLORHEXIDINE GLUCONATE 0.12 % MT SOLN: 15.0000 mL | Freq: Two times a day (BID) | OROMUCOSAL | 0 refills | Status: DC

## 2020-10-07 MED ORDER — PROPOFOL 10 MG/ML IV BOLUS
INTRAVENOUS | Status: DC | PRN
  Administered 2020-10-07: 150 mg via INTRAVENOUS

## 2020-10-07 MED ORDER — MIDAZOLAM HCL 5 MG/5ML IJ SOLN
INTRAMUSCULAR | Status: DC | PRN
  Administered 2020-10-07: 2 mg via INTRAVENOUS

## 2020-10-07 MED ORDER — GLYCOPYRROLATE 0.2 MG/ML IJ SOLN
INTRAMUSCULAR | Status: DC | PRN
  Administered 2020-10-07: .1 mg via INTRAVENOUS

## 2020-10-07 MED ORDER — LIDOCAINE-EPINEPHRINE 1 %-1:100000 IJ SOLN
INTRAMUSCULAR | Status: DC | PRN
  Administered 2020-10-07: 2 mL

## 2020-10-07 MED ORDER — TRAMADOL HCL 50 MG PO TABS: 50.0000 mg | ORAL_TABLET | Freq: Four times a day (QID) | ORAL | 0 refills | Status: AC | PRN

## 2020-10-07 MED ORDER — DEXAMETHASONE SODIUM PHOSPHATE 4 MG/ML IJ SOLN
INTRAMUSCULAR | Status: DC | PRN
  Administered 2020-10-07: 10 mg via INTRAVENOUS

## 2020-10-07 MED ORDER — SCOPOLAMINE 1 MG/3DAYS TD PT72
1.0000 | MEDICATED_PATCH | Freq: Once | TRANSDERMAL | Status: DC
  Administered 2020-10-07: 1.5 mg via TRANSDERMAL

## 2020-10-07 SURGICAL SUPPLY — 18 items
BLADE SURG 15 STRL LF DISP TIS (BLADE) IMPLANT
BLADE SURG 15 STRL SS (BLADE) ×2
ELECT CAUTERY NDL 2.0 MIC (NEEDLE) ×1 IMPLANT
ELECT CAUTERY NEEDLE 2.0 MIC (NEEDLE) ×2 IMPLANT
ELECT REM PT RETURN 9FT ADLT (ELECTROSURGICAL) ×2
ELECTRODE REM PT RTRN 9FT ADLT (ELECTROSURGICAL) ×1 IMPLANT
GLOVE SURG GAMMEX PI TX LF 7.5 (GLOVE) ×4 IMPLANT
GOWN STRL REUS W/ TWL LRG LVL3 (GOWN DISPOSABLE) ×1 IMPLANT
GOWN STRL REUS W/TWL LRG LVL3 (GOWN DISPOSABLE) ×2
KIT TURNOVER KIT A (KITS) ×2 IMPLANT
NDL HYPO 25GX1X1/2 BEV (NEEDLE) ×1 IMPLANT
NEEDLE HYPO 25GX1X1/2 BEV (NEEDLE) ×2 IMPLANT
NS IRRIG 500ML POUR BTL (IV SOLUTION) ×2 IMPLANT
PACK ENT CUSTOM (PACKS) ×2 IMPLANT
PENCIL SMOKE EVACUATOR (MISCELLANEOUS) ×2 IMPLANT
SUCTION FRAZIER HANDLE 10FR (MISCELLANEOUS) ×2
SUCTION TUBE FRAZIER 10FR DISP (MISCELLANEOUS) IMPLANT
SUT CHROMIC 4 0 RB 1X27 (SUTURE) ×1 IMPLANT

## 2020-10-07 SURGICAL SUPPLY — 36 items
ADH SKN CLS APL DERMABOND .7 (GAUZE/BANDAGES/DRESSINGS)
APL FBRTP 3 NS LF CTTN WD (MISCELLANEOUS) ×1
APPLICATOR COTTON TIP 3IN (MISCELLANEOUS) ×2 IMPLANT
CORD BIP STRL DISP 12FT (MISCELLANEOUS) IMPLANT
DERMABOND ADVANCED (GAUZE/BANDAGES/DRESSINGS)
DERMABOND ADVANCED .7 DNX12 (GAUZE/BANDAGES/DRESSINGS) IMPLANT
DRSG TELFA 4X3 1S NADH ST (GAUZE/BANDAGES/DRESSINGS) IMPLANT
ELECT CAUTERY BLADE TIP 2.5 (TIP)
ELECT CAUTERY NDL 2.0 MIC (NEEDLE) ×1 IMPLANT
ELECT CAUTERY NEEDLE 2.0 MIC (NEEDLE) ×2 IMPLANT
ELECT REM PT RETURN 9FT ADLT (ELECTROSURGICAL) ×2
ELECTRODE CAUTERY BLDE TIP 2.5 (TIP) IMPLANT
ELECTRODE REM PT RTRN 9FT ADLT (ELECTROSURGICAL) ×1 IMPLANT
GAUZE SPONGE 4X4 12PLY STRL (GAUZE/BANDAGES/DRESSINGS) IMPLANT
GLOVE SURG GAMMEX PI TX LF 7.5 (GLOVE) ×4 IMPLANT
GOWN STRL REUS W/ TWL LRG LVL3 (GOWN DISPOSABLE) ×1 IMPLANT
GOWN STRL REUS W/TWL LRG LVL3 (GOWN DISPOSABLE) ×2
KIT TURNOVER KIT A (KITS) ×2 IMPLANT
NDL HYPO 25GX1X1/2 BEV (NEEDLE) ×1 IMPLANT
NEEDLE HYPO 25GX1X1/2 BEV (NEEDLE) ×2 IMPLANT
NS IRRIG 500ML POUR BTL (IV SOLUTION) ×2 IMPLANT
PACK ENT CUSTOM (PACKS) ×2 IMPLANT
PENCIL SMOKE EVACUATOR (MISCELLANEOUS) ×2 IMPLANT
SOL PREP PVP 2OZ (MISCELLANEOUS) ×2
SOLUTION PREP PVP 2OZ (MISCELLANEOUS) ×1 IMPLANT
SPONGE KITTNER 5P (MISCELLANEOUS) ×2 IMPLANT
STRAP BODY AND KNEE 60X3 (MISCELLANEOUS) ×2 IMPLANT
SUCTION FRAZIER HANDLE 10FR (MISCELLANEOUS)
SUCTION FRAZIER TIP 8 FR DISP (SUCTIONS) ×2
SUCTION TUBE FRAZIER 10FR DISP (MISCELLANEOUS) IMPLANT
SUCTION TUBE FRAZIER 8FR DISP (SUCTIONS) IMPLANT
SUT PROLENE 5 0 P 3 (SUTURE) ×2 IMPLANT
SUT VIC AB 4-0 RB1 27 (SUTURE) ×2
SUT VIC AB 4-0 RB1 27X BRD (SUTURE) ×1 IMPLANT
SYR 10ML LL (SYRINGE) ×2 IMPLANT
SYR EAR/ULCER 2OZ (SYRINGE) ×2 IMPLANT

## 2020-10-07 NOTE — Transfer of Care (Signed)
Immediate Anesthesia Transfer of Care Note  Patient: Priscilla Greene  Procedure(s) Performed: EXCISION OF HARD PALATE MASS  Patient Location: PACU  Anesthesia Type: General  Level of Consciousness: awake, alert  and patient cooperative  Airway and Oxygen Therapy: Patient Spontanous Breathing and Patient connected to supplemental oxygen  Post-op Assessment: Post-op Vital signs reviewed, Patient's Cardiovascular Status Stable, Respiratory Function Stable, Patent Airway and No signs of Nausea or vomiting  Post-op Vital Signs: Reviewed and stable  Complications: No notable events documented.

## 2020-10-07 NOTE — Anesthesia Procedure Notes (Signed)
Procedure Name: Intubation Date/Time: 10/07/2020 11:39 AM Performed by: Mayme Genta, CRNA Pre-anesthesia Checklist: Patient identified, Emergency Drugs available, Suction available, Patient being monitored and Timeout performed Patient Re-evaluated:Patient Re-evaluated prior to induction Oxygen Delivery Method: Circle system utilized Preoxygenation: Pre-oxygenation with 100% oxygen Induction Type: IV induction Ventilation: Mask ventilation without difficulty Laryngoscope Size: Mac and 3 Grade View: Grade I Tube type: Oral Rae Tube size: 7.0 mm Number of attempts: 1 Placement Confirmation: ETT inserted through vocal cords under direct vision, positive ETCO2 and breath sounds checked- equal and bilateral Tube secured with: Tape Dental Injury: Teeth and Oropharynx as per pre-operative assessment

## 2020-10-07 NOTE — Progress Notes (Signed)
On arrival to PACU patient requested to speak with ENT MD because she stated that she still felt the lesion she was seen for originally. ENT at bedside and decision made between PT, MD and Husband to go back to OR to remove another area. Patient must wait until 1145 due to NPO status.

## 2020-10-07 NOTE — Addendum Note (Signed)
Addendum  created 10/07/20 1337 by Veda Canning, MD   Intraprocedure Event edited, Intraprocedure Staff edited

## 2020-10-07 NOTE — H&P (Signed)
..  History and Physical paper copy reviewed and updated date of procedure and will be scanned into system.  Patient seen and examined.  

## 2020-10-07 NOTE — Anesthesia Postprocedure Evaluation (Signed)
Anesthesia Post Note  Patient: Priscilla Greene  Procedure(s) Performed: EXCISION OF HARD PALATE MASS     Patient location during evaluation: PACU Anesthesia Type: General Level of consciousness: awake Pain management: pain level controlled Vital Signs Assessment: post-procedure vital signs reviewed and stable Respiratory status: respiratory function stable Cardiovascular status: stable Postop Assessment: no signs of nausea or vomiting Anesthetic complications: no   No notable events documented.  Veda Canning

## 2020-10-07 NOTE — Anesthesia Preprocedure Evaluation (Addendum)
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 (2015)   GI/Hepatic PUD, GERD  ,  Endo/Other  Hypothyroidism (hx thyroid cancer)   Renal/GU      Musculoskeletal  (+) Arthritis ,   Abdominal   Peds  Hematology   Anesthesia Other Findings BMI 30 Hx post op PE  Reproductive/Obstetrics                            Anesthesia Physical Anesthesia Plan  ASA: 3  Anesthesia Plan: General   Post-op Pain Management:    Induction: Intravenous  PONV Risk Score and Plan: Ondansetron, Dexamethasone, Midazolam and Treatment may vary due to age or medical condition  Airway Management Planned: Oral ETT  Additional Equipment:   Intra-op Plan:   Post-operative Plan:   Informed Consent: I have reviewed the patients History and Physical, chart, labs and discussed the procedure including the risks, benefits and alternatives for the proposed anesthesia with the patient or authorized representative who has indicated his/her understanding and acceptance.     Dental advisory given  Plan Discussed with: CRNA  Anesthesia Plan Comments:         Anesthesia Quick Evaluation

## 2020-10-07 NOTE — Op Note (Addendum)
..  10/07/2020  9:35 AM    Elpidio Galea  [id number]   Pre-Op Dx:  hard palate mass  Post-op Dx: hard palate mass  Proc: Excision of hard palate mass  Surg: Jeannie Fend Mitzie Marlar  Anes:  General Endotracheal  EBL:  <68ml  Comp:  None  Findings:  Broad based but pedunculated lesion in post-gingival hard palate on the left.  Papillomatous appearing mucosa of entire mucosal surface of hard palate behind maxillary incisors.  Mass excised and base cauterized at most abnormal part on left side.  Procedure: After the patient was identified in holding and the history and physical and consent was reviewed, the patient was taken to the operating room and placed in a supine position.  General endotracheal anesthesia was induced in the normal fashion.  At this time, the patient was rotated 45 degrees.  At this time, a McIvor mouthgag was inserted into the patient's oral cavity and suspended from the [place] stand without injury to teeth, lips, or gums.  This demonstrated papillomatous appearing mucosa behind the maxillary incisors but entire mucosa covered in similar appearance consistent with possible irritation.  A broad based pedunculated area of mucosa was identified.  This was gently grasped with forceps and then a 15 blade scalpel was used to excise this area.  The excision wound was cauterized with needle tip bovie cautery.  80ml of 1% lidocaine with 1:100,000 epinephrine was injected into the hard palate.  Hemostasis was achieved.  No further pedunculated lesions were identified.  Following this  The care of patient was returned to anesthesia, awakened, and transferred to recovery in stable condition.  Dispo:  PACU to home  Plan: Soft diet.  Fluid hydration  Recheck my office three weeks.   Jeannie Fend Nuvia Hileman 9:35 AM 10/07/2020

## 2020-10-07 NOTE — Transfer of Care (Signed)
Immediate Anesthesia Transfer of Care Note  Patient: Priscilla Greene  Procedure(s) Performed: EXCISION MASS  Patient Location: PACU  Anesthesia Type: General  Level of Consciousness: awake, alert  and patient cooperative  Airway and Oxygen Therapy: Patient Spontanous Breathing and Patient connected to supplemental oxygen  Post-op Assessment: Post-op Vital signs reviewed, Patient's Cardiovascular Status Stable, Respiratory Function Stable, Patent Airway and No signs of Nausea or vomiting  Post-op Vital Signs: Reviewed and stable  Complications: No notable events documented.

## 2020-10-07 NOTE — Anesthesia Postprocedure Evaluation (Signed)
Anesthesia Post Note  Patient: Priscilla Greene  Procedure(s) Performed: EXCISION MASS     Patient location during evaluation: PACU Anesthesia Type: General Level of consciousness: awake Pain management: pain level controlled Vital Signs Assessment: post-procedure vital signs reviewed and stable Respiratory status: respiratory function stable Cardiovascular status: stable Postop Assessment: no signs of nausea or vomiting Anesthetic complications: no   No notable events documented.  Veda Canning

## 2020-10-07 NOTE — Anesthesia Procedure Notes (Signed)
Procedure Name: Intubation Date/Time: 10/07/2020 9:20 AM Performed by: Mayme Genta, CRNA Pre-anesthesia Checklist: Patient identified, Emergency Drugs available, Suction available, Patient being monitored and Timeout performed Patient Re-evaluated:Patient Re-evaluated prior to induction Oxygen Delivery Method: Circle system utilized Preoxygenation: Pre-oxygenation with 100% oxygen Induction Type: IV induction Ventilation: Mask ventilation without difficulty Laryngoscope Size: Miller and 2 Grade View: Grade I Tube type: Oral Rae Tube size: 7.0 mm Number of attempts: 1 Placement Confirmation: ETT inserted through vocal cords under direct vision, positive ETCO2 and breath sounds checked- equal and bilateral Tube secured with: Tape Dental Injury: Teeth and Oropharynx as per pre-operative assessment

## 2020-10-07 NOTE — H&P (Signed)
..  History and Physical paper copy reviewed and updated date of procedure and will be scanned into system.  Patient underwent excision of hard palate lesion earlier today on 10/07/2020.  Under anesthesia, instead of isolated area of concern, the entire mucosal surface of her post-incisor area was abnormal appearing with papillomatous appearing mucosa.  Due to this the most abnormal area was excised just left of midline.  Upon awakening, the patient was concerned regarding that the initial area of concern was still present when she moved her tongue over it.  Discussed with husband and patient that instead of isolated area, once patient was asleep the entire mucosal surface of her hard palate just behind the incisors was abnormal and due to that I excised the most abnormal appearing area.  Patient concerned due to her anxiety that she would have an issue returning for further procedure, discussion made and patient wishes to proceed with excision of additional area that she can still feel with tip of tongue.  Patient and husband understand that I removed the most abnormal appearing area and whatever process is going on, I anticipate the additional area to be similar.  They understand and wish proceed with additional excision of area just to right of midline that she can still feel with her tongue.  Similar risks of procedure and will proceed once cleared from anesthesia perspective.  Discussed with patient and husband that additional procedure will be billed by facility due to returning to OR.

## 2020-10-07 NOTE — Op Note (Signed)
..  10/07/2020  11:55 AM    Elpidio Galea  [id number]   Pre-Op Dx:  hard palate mass  Post-op Dx: hard palate mass  Proc:Excision of additional hard palate mass  Surg: Jeannie Fend Keir Foland  Anes:  General Endotracheal  EBL:  <53ml  Comp:  None  Findings:  After previous surgery demonstrated diffuse mucosal papillomatous and irritated mucosa along the patient's entire hard palate behind her incisiors, during the previous excision of hard palate mass I excised the most abnormal appearing mucosa which was a pedunculated lesion.  This, however, was not what patient could feel with tip of tongue that was to the right of the previous most abnormal appearing mucosa.  After discussion with patient and husband, decision made to remove additional hard palate region that despite more normal in appearance than previous excision, patient wished to have removed as well as she could feel it with the tip of her tongue.  Additional consent obtained for removal of additional lesion.  Procedure: After the patient was identified in holding and the history and physical and consent was reviewed, the patient was taken to the operating room and placed in a supine position.  General endotracheal anesthesia was induced in the normal fashion.  At this time, the patient was rotated 45 degrees and a shoulder roll was placed.  At this time, a McIvor mouthgag was inserted into the patient's oral cavity and suspended from the [place] stand without injury to teeth, lips, or gums.  The hard palate was evaluated and previous excision site examined.  To the right of midline was patient's area of concern that appeared to be rugae of hard palate that patient could palpate this tongue.  This area was excised with 15 blade scalpel and Bovie suction cautery.  This area was injected with 1% lidocaine with 1:100,00 epinephrine 66ml.  The additional biopsy will be sent for permanent pathological evaluation in addition to original pathological  specimen.  Following this  The care of patient was returned to anesthesia, awakened, and transferred to recovery in stable condition.  Dispo:  PACU to home  Plan: Soft diet.  Fluid hydration  Recheck my office one week.   Jeannie Fend Marjan Rosman 11:55 AM 10/07/2020

## 2020-10-07 NOTE — Anesthesia Preprocedure Evaluation (Addendum)
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 (2015)   GI/Hepatic PUD, GERD  ,  Endo/Other  Hypothyroidism (hx thyroid cancer)   Renal/GU      Musculoskeletal  (+) Arthritis ,   Abdominal   Peds  Hematology   Anesthesia Other Findings BMI 30 Hx post op PE  Reproductive/Obstetrics                            Anesthesia Physical  Anesthesia Plan  ASA: 3  Anesthesia Plan: General   Post-op Pain Management:    Induction: Intravenous  PONV Risk Score and Plan: Ondansetron, Dexamethasone, Midazolam and Treatment may vary due to age or medical condition  Airway Management Planned: Oral ETT  Additional Equipment:   Intra-op Plan:   Post-operative Plan:   Informed Consent: I have reviewed the patients History and Physical, chart, labs and discussed the procedure including the risks, benefits and alternatives for the proposed anesthesia with the patient or authorized representative who has indicated his/her understanding and acceptance.     Dental advisory given  Plan Discussed with: CRNA  Anesthesia Plan Comments: (Patient going back to OR after long discussion between patient, patient's husband and Dr. Pryor Ochoa. See Dr. Darien Ramus notes for details of discussion and reason for second procedure.)       Anesthesia Quick Evaluation

## 2020-10-08 ENCOUNTER — Encounter: Payer: Self-pay | Admitting: Otolaryngology

## 2020-10-09 LAB — SURGICAL PATHOLOGY

## 2020-10-12 DIAGNOSIS — Z23 Encounter for immunization: Secondary | ICD-10-CM | POA: Diagnosis not present

## 2021-10-21 ENCOUNTER — Encounter: Payer: Self-pay | Admitting: Internal Medicine

## 2021-10-21 ENCOUNTER — Ambulatory Visit: Payer: BC Managed Care – PPO | Attending: Internal Medicine | Admitting: Internal Medicine

## 2021-10-21 VITALS — BP 131/80 | HR 50 | Ht 64.0 in | Wt 154.0 lb

## 2021-10-21 DIAGNOSIS — G909 Disorder of the autonomic nervous system, unspecified: Secondary | ICD-10-CM | POA: Diagnosis not present

## 2021-10-21 DIAGNOSIS — Z86711 Personal history of pulmonary embolism: Secondary | ICD-10-CM

## 2021-10-21 DIAGNOSIS — E7800 Pure hypercholesterolemia, unspecified: Secondary | ICD-10-CM

## 2021-10-21 DIAGNOSIS — I4711 Inappropriate sinus tachycardia, so stated: Secondary | ICD-10-CM

## 2021-10-21 DIAGNOSIS — R55 Syncope and collapse: Secondary | ICD-10-CM

## 2021-10-21 DIAGNOSIS — I493 Ventricular premature depolarization: Secondary | ICD-10-CM | POA: Diagnosis not present

## 2021-10-21 DIAGNOSIS — I4729 Other ventricular tachycardia: Secondary | ICD-10-CM

## 2021-10-21 DIAGNOSIS — R079 Chest pain, unspecified: Secondary | ICD-10-CM

## 2021-10-21 DIAGNOSIS — Z86718 Personal history of other venous thrombosis and embolism: Secondary | ICD-10-CM

## 2021-10-21 DIAGNOSIS — R002 Palpitations: Secondary | ICD-10-CM

## 2021-10-21 NOTE — Patient Instructions (Signed)
Medication Instructions:   Your physician recommends that you continue on your current medications as directed. Please refer to the Current Medication list given to you today.  *If you need a refill on your cardiac medications before your next appointment, please call your pharmacy*   Lab Work:  None Ordered  If you have labs (blood work) drawn today and your tests are completely normal, you will receive your results only by: MyChart Message (if you have MyChart) OR A paper copy in the mail If you have any lab test that is abnormal or we need to change your treatment, we will call you to review the results.   Testing/Procedures:  None Ordered   Follow-Up: At [facility], you and your health needs are our priority.  As part of our continuing mission to provide you with exceptional heart care, we have created designated Provider Care Teams.  These Care Teams include your primary Cardiologist (physician) and Advanced Practice Providers (APPs -  Physician Assistants and Nurse Practitioners) who all work together to provide you with the care you need, when you need it.  We recommend signing up for the patient portal called "MyChart".  Sign up information is provided on this After Visit Summary.  MyChart is used to connect with patients for Virtual Visits (Telemedicine).  Patients are able to view lab/test results, encounter notes, upcoming appointments, etc.  Non-urgent messages can be sent to your provider as well.   To learn more about what you can do with MyChart, go to [email or web address].    Your next appointment:   6 month(s)  The format for your next appointment:   In Person  Provider:   You may see Christopher End, MD or one of the following Advanced Practice Providers on your designated Care Team:   Christopher Berge, NP Ryan Dunn, PA-C Cadence Furth, PA-C Sheri Hammock, NP    

## 2021-10-21 NOTE — Progress Notes (Signed)
New Outpatient Visit Date: 10/21/2021  Referring Provider: Jordan Hawks, MD Priscilla Greene  Chief Complaint: Establish cardiology care  HPI:  Priscilla Greene is a 56 y.o. female who is being seen today for the evaluation of NSVT and syncope at the request of Dr. Ubaldo Glassing.  She was previously followed by Dr. Ubaldo Glassing, having last been seen in June.  She has an extensive dysrhythmia history and is also followed at [facility] by the dysautonomia clinic.  Her most recent EP evaluation at [facility] occurred in February.  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  Today, Priscilla Greene reports that she is feeling fairly well but is still recovering from COVID-19 infection 2.5 weeks ago.  She initially had very high fevers of up to 105.1 F.  She was very somnolent during this time and subsequently developed myalgias, headache, sinus congestion, and mild shortness of breath.  The symptoms have resolved.  She also had a brief syncopal episode near the start of her COVID-19 infection.  She has a long history of neurocardiogenic syncope with episodes usually occurring 3 times a year.  She also notes random chest pains that are not exertional or related to other specific activities which resolved with sublingual nitroglycerin.  Palpitations have generally been well controlled with nadolol.  She has not tolerated discontinuation or transition to an alternative beta-blocker.  She does not have any lower extremity edema, regularly wearing compression stockings to help with her autonomic dysfunction.  --------------------------------------------------------------------------------------------------  Cardiovascular History & Procedures: Cardiovascular  Problems: Syncope Inappropriate sinus tachycardia NSVT/PVCs  Risk Factors: Hyperlipidemia and family history  Cath/PCI: None  CV Surgery: None  EP Procedures and Devices: Tilt table test (06/19/2014): Inappropriate sinus tachycardia.  Non-Invasive Evaluation(s): Pharmacologic MPI (08/01/2019): Normal study without evidence of ischemia or scar.  LVEF 57%. Coronary CTA (05/22/2019): Normal origin of coronary arteries without atherosclerotic disease or coronary calcification. TTE (05/15/2019): Normal biventricular systolic function.  Mild mitral and tricuspid regurgitation.  Trivial aortic regurgitation. Cardiac MRI (06/13/2017): LVEF 65% with normal wall motion.  Normal LV size and wall thickness.  Normal RV size and function.  Normal biatrial size.  No significant valvular disease.  No abnormal delayed hyperenhancement.  Recent CV Pertinent Labs: Lab Results  Component Value Date   CHOL 299 (H) 01/09/2014   HDL 59 01/09/2014   LDLCALC 209 (H) 01/09/2014   TRIG 157 01/09/2014   INR 0.94 05/20/2017   K 3.4 (L) 01/28/2018   K 4.6 01/08/2014   MG 2.3 01/08/2014   BUN 20 01/28/2018   BUN 17 01/08/2014   CREATININE 0.60 01/28/2018   CREATININE 0.68 01/08/2014    --------------------------------------------------------------------------------------------------  Past Medical History:  Diagnosis Date   Arthritis    Undifferentiated connective tissue disorder   Asthma    Atrial flutter (HCC)    post-operative following thyroidectomy   Cancer ([place])    THYROID CANCER   Duodenal ulcer 1986   DVT (deep venous thrombosis) ([place]) 2005   popliteal vein; questionable history of protein S deficiency   Encounter for interrogation of cardiac recorder    Loop recorder   Family history of adverse reaction to anesthesia    Mother - PONV   GERD (gastroesophageal reflux disease)    Headache(784.0)    migraines  History of hypotension    Hypothyroidism    Motion sickness    cruise ships    Nonsustained ventricular tachycardia (HCC)    Palpitations    Hx of   PONV (postoperative nausea and vomiting)    Postoperative pulmonary embolism (HCC)    Hx of   Pulmonary embolism ([place]) 1995   Following hysterectomy   Syncope    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   TIA (transient ischemic attack) 2015   Wears contact lenses    Wears dentures    partial upper   White coat hypertension     Past Surgical History:  Procedure Laterality Date   ABDOMINAL HYSTERECTOMY     CARDIAC ELECTROPHYSIOLOGY MAPPING AND ABLATION     COLONOSCOPY WITH PROPOFOL N/A 09/21/2016   Procedure: COLONOSCOPY WITH PROPOFOL;  Surgeon: Manya Silvas, MD;  Location[facility] ENDOSCOPY;  Service: Endoscopy;  Laterality: N/A;   DIAGNOSTIC LAPAROSCOPY     ESOPHAGOGASTRODUODENOSCOPY (EGD) WITH PROPOFOL N/A 09/21/2016   Procedure: ESOPHAGOGASTRODUODENOSCOPY (EGD) WITH PROPOFOL;  Surgeon: Manya Silvas, MD;  Location: [facility] ENDOSCOPY;  Service: Endoscopy;  Laterality: N/A;   LAPAROSCOPY     LEFT OOPHORECTOMY     LOOP RECORDER     Implanted 2000, removed 2002   MASS EXCISION N/A 10/07/2020   Procedure: EXCISION OF HARD PALATE MASS;  Surgeon: Carloyn Manner, MD;  Location: [place];  Service: ENT;  Laterality: N/A;   MASS EXCISION N/A 10/07/2020   Procedure: EXCISION MASS;  Surgeon: Carloyn Manner, MD;  Location: [place];  Service: ENT;  Laterality: N/A;   SAVORY DILATION N/A 09/21/2016   Procedure: Azzie Almas DILATION;  Surgeon: Manya Silvas, MD;  Location: [facility] ENDOSCOPY;  Service: Endoscopy;  Laterality: N/A;   THYROID LOBECTOMY Bilateral 10/18/2012   Procedure: total thyroidectomy with limited central compartment lympy node disection;  Surgeon: Earnstine Regal, MD;  Location: [facility] ORS;  Service: General;  Laterality: Bilateral;   TONSILLECTOMY     VULVECTOMY     Partial    Current Meds  Medication Sig   acetaminophen  (TYLENOL) 500 MG tablet Take 500 mg by mouth every 6 (six) hours as needed for pain.   albuterol (PROVENTIL HFA;VENTOLIN HFA) 108 (90 BASE) MCG/ACT inhaler Inhale 1-2 puffs into the lungs every 6 (six) hours as needed for wheezing or shortness of breath.   aspirin 81 MG EC tablet Take 81 mg by mouth daily.     Calcium Carb-Cholecalciferol 500-400 MG-UNIT TABS Take 1 tablet by mouth daily.   Cholecalciferol (VITAMIN D3) 50 MCG (2000 UT) TABS Take by mouth daily.   diphenhydrAMINE (BENADRYL) 12.5 MG/5ML liquid Take 5 mLs by mouth as needed.   flecainide (TAMBOCOR) 50 MG tablet Take 50 mg by mouth every 12 (twelve) hours.     levothyroxine (SYNTHROID) 125 MCG tablet Take 1 tablet by mouth daily.   MAGNESIUM GLYCINATE PLUS PO Take 120 mg by mouth daily.   Multiple Vitamins-Minerals (AIRBORNE GUMMIES) CHEW Chew 1 Dose by mouth as needed.   mupirocin ointment (BACTROBAN) 2 % Apply 1 Application topically as needed.   nadolol (CORGARD) 20 MG tablet Take 10 mg by mouth daily.    nitroGLYCERIN (NITROSTAT) 0.4 MG SL tablet Place 1 tablet under the tongue as directed.   Probiotic Product (PROBIOTIC PO) Take by mouth daily with breakfast.   valACYclovir (VALTREX) 1000 MG tablet Take 2,000 mg by mouth 2 (two) times daily as  needed.   vitamin E 180 MG (400 UNITS) capsule Take 400 Units by mouth daily.    Allergies: Contrast media [iodinated contrast media], Vancomycin, Amoxicillin-pot clavulanate, Bystolic [nebivolol hcl], Codeine, Effexor [venlafaxine], Erythromycin, Etanercept, Lactose intolerance (gi), Meperidine hcl, Paxil [paroxetine], Plaquenil [hydroxychloroquine], Prozac [fluoxetine hcl], Septra ds [sulfamethoxazole-trimethoprim], Tape, and Topamax [topiramate]  Social History   Tobacco Use   Smoking status: Never   Smokeless tobacco: Never  Vaping Use   Vaping Use: Never used  Substance Use Topics   Alcohol use: No    Alcohol/week: 0.0 standard drinks of alcohol   Drug use: No    Family  History  Problem Relation Age of Onset   Diabetes Mother    Breast cancer Mother 56   Stroke Mother    Heart attack Father 28   Heart disease Father 69   Coronary artery disease Father 39   Breast cancer Other    Colon cancer Other     Review of Systems: A 12-system review of systems was performed and was negative except as noted in the HPI.  --------------------------------------------------------------------------------------------------  Physical Exam: BP 131/80 (BP Location: Right Arm, Patient Position: Sitting, Cuff Size: Normal)   Pulse (!) 50   Ht '5\' 4"'$  (1.626 m)   Wt 154 lb (69.9 kg)   SpO2 98%   BMI 26.43 kg/m  Position Blood pressure (mmHg) Heart rate (bpm)  Lying 153/85 52  Sitting 132/79 56  Standing 121/85 66  Standing (3 minutes) 151/96 57    General:  NAD. HEENT: No conjunctival pallor or scleral icterus. Neck: Supple without lymphadenopathy JVD, or HJR. Lungs: Normal work of breathing. Clear to auscultation bilaterally without wheezes or crackles. Heart: Bradycardic but regular without murmurs, rubs, or gallops. Non-displaced PMI. Abd: Bowel sounds present. Soft, NT/ND without hepatosplenomegaly Ext: No lower extremity edema with compression stockings in place. Radial, PT, and DP pulses are 2+ bilaterally Skin: Warm and dry.  EKG: Sinus bradycardia (heart rate 50 bpm) without significant abnormalities.  --------------------------------------------------------------------------------------------------  ASSESSMENT AND PLAN: Inappropriate sinus tachycardia, autonomic dysfunction, and vasovagal syncope: These have been a longstanding problems for Priscilla Greene and have been comanaged in the past by Dr. Ubaldo Glassing and the autonomic disorders clinic at [facility].  Vital signs today demonstrate significant orthostatic blood pressure drop.  I encouraged Priscilla Greene to continue using her compression devices and to stay well-hydrated.  Though she is bradycardic at baseline, she  has not tolerated adjustments to her low-dose nadolol (discontinuation or transition to nebivolol).  She should continue following up with the [facility] autonomic disorders clinic as previously arranged.  PVCs and nonsustained ventricular tachycardia: Minimal palpitations noted.  We will continue current doses of flecainide and nadolol.  Chest pain: This has been longstanding and sporadic with response to nitroglycerin.  Prior coronary CTA and myocardial perfusion stress tests have shown no evidence of atherosclerotic CAD or ischemia/scar.  There has been concern for vasospasm in the past.  It is reasonable to continue as needed nitroglycerin.  Hyperlipidemia: Priscilla Greene is working on diet and exercise to help control her hyperlipidemia.  Given absence of established ASCVD and a 10-year ASCVD risk score of 4.3%, I think it is reasonable to defer addition of a statin at this time.  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  Priscilla Greene notes that she was referred to a hematologist at one point though she never scheduled a visit.  Her  PE in 1995 was provoked by hysterectomy.  I think it would be worthwhile to consider repeat testing for protein S deficiency and potential consultation with a hematologist.  I encouraged Priscilla Greene to discuss this with Dr. Sabra Heck at their upcoming visit.  Careful weighing of risks and benefits of long-term anticoagulation will need to be undertaken given her history of recurrent syncope if it is felt that long-term anticoagulation is indicated.  Follow-up: Return to clinic in 6 months.  Priscilla Bush, MD 10/22/2021 8:36 AM

## 2021-10-22 ENCOUNTER — Encounter: Payer: Self-pay | Admitting: Internal Medicine

## 2021-10-22 DIAGNOSIS — I4729 Other ventricular tachycardia: Secondary | ICD-10-CM | POA: Insufficient documentation

## 2021-10-22 DIAGNOSIS — G909 Disorder of the autonomic nervous system, unspecified: Secondary | ICD-10-CM | POA: Insufficient documentation

## 2021-10-22 DIAGNOSIS — Z86718 Personal history of other venous thrombosis and embolism: Secondary | ICD-10-CM | POA: Insufficient documentation

## 2021-10-22 DIAGNOSIS — Z86711 Personal history of pulmonary embolism: Secondary | ICD-10-CM | POA: Insufficient documentation

## 2021-10-22 DIAGNOSIS — I493 Ventricular premature depolarization: Secondary | ICD-10-CM | POA: Insufficient documentation

## 2021-10-22 DIAGNOSIS — R079 Chest pain, unspecified: Secondary | ICD-10-CM | POA: Insufficient documentation

## 2021-11-10 ENCOUNTER — Other Ambulatory Visit: Payer: Self-pay | Admitting: Internal Medicine

## 2021-11-10 DIAGNOSIS — R1313 Dysphagia, pharyngeal phase: Secondary | ICD-10-CM

## 2021-11-16 ENCOUNTER — Ambulatory Visit
Admission: RE | Admit: 2021-11-16 | Discharge: 2021-11-16 | Disposition: A | Discharge status: Home / Self Care | Payer: BC Managed Care – PPO | Source: Ambulatory Visit | Attending: Internal Medicine | Admitting: Internal Medicine

## 2021-11-16 DIAGNOSIS — R1313 Dysphagia, pharyngeal phase: Secondary | ICD-10-CM | POA: Diagnosis present

## 2022-03-01 ENCOUNTER — Encounter: Payer: Self-pay | Admitting: Internal Medicine

## 2022-03-02 ENCOUNTER — Other Ambulatory Visit: Payer: Self-pay

## 2022-03-02 ENCOUNTER — Encounter: Payer: Self-pay | Admitting: Internal Medicine

## 2022-03-02 ENCOUNTER — Ambulatory Visit
Admission: RE | Admit: 2022-03-02 | Discharge: 2022-03-02 | Disposition: A | Discharge status: Home / Self Care | Payer: BC Managed Care – PPO | Attending: Internal Medicine | Admitting: Internal Medicine

## 2022-03-02 ENCOUNTER — Encounter
Admission: RE | Disposition: A | Discharge status: Home / Self Care | Payer: Self-pay | Source: Home / Self Care | Attending: Internal Medicine

## 2022-03-02 ENCOUNTER — Ambulatory Visit: Payer: BC Managed Care – PPO | Admitting: Registered Nurse

## 2022-03-02 DIAGNOSIS — Z86718 Personal history of other venous thrombosis and embolism: Secondary | ICD-10-CM | POA: Insufficient documentation

## 2022-03-02 DIAGNOSIS — Z86711 Personal history of pulmonary embolism: Secondary | ICD-10-CM | POA: Insufficient documentation

## 2022-03-02 DIAGNOSIS — Z7982 Long term (current) use of aspirin: Secondary | ICD-10-CM | POA: Insufficient documentation

## 2022-03-02 DIAGNOSIS — M069 Rheumatoid arthritis, unspecified: Secondary | ICD-10-CM | POA: Diagnosis not present

## 2022-03-02 DIAGNOSIS — J45909 Unspecified asthma, uncomplicated: Secondary | ICD-10-CM | POA: Insufficient documentation

## 2022-03-02 DIAGNOSIS — E119 Type 2 diabetes mellitus without complications: Secondary | ICD-10-CM | POA: Diagnosis not present

## 2022-03-02 DIAGNOSIS — Z85850 Personal history of malignant neoplasm of thyroid: Secondary | ICD-10-CM | POA: Insufficient documentation

## 2022-03-02 DIAGNOSIS — K640 First degree hemorrhoids: Secondary | ICD-10-CM | POA: Diagnosis not present

## 2022-03-02 DIAGNOSIS — R1314 Dysphagia, pharyngoesophageal phase: Secondary | ICD-10-CM | POA: Diagnosis not present

## 2022-03-02 DIAGNOSIS — Z79890 Hormone replacement therapy (postmenopausal): Secondary | ICD-10-CM | POA: Diagnosis not present

## 2022-03-02 DIAGNOSIS — K5730 Diverticulosis of large intestine without perforation or abscess without bleeding: Secondary | ICD-10-CM | POA: Insufficient documentation

## 2022-03-02 DIAGNOSIS — I10 Essential (primary) hypertension: Secondary | ICD-10-CM | POA: Diagnosis not present

## 2022-03-02 DIAGNOSIS — Z1211 Encounter for screening for malignant neoplasm of colon: Secondary | ICD-10-CM | POA: Insufficient documentation

## 2022-03-02 DIAGNOSIS — K219 Gastro-esophageal reflux disease without esophagitis: Secondary | ICD-10-CM | POA: Diagnosis not present

## 2022-03-02 DIAGNOSIS — E039 Hypothyroidism, unspecified: Secondary | ICD-10-CM | POA: Insufficient documentation

## 2022-03-02 DIAGNOSIS — Z86010 Personal history of colonic polyps: Secondary | ICD-10-CM | POA: Diagnosis not present

## 2022-03-02 HISTORY — PX: ESOPHAGOGASTRODUODENOSCOPY: SHX5428

## 2022-03-02 HISTORY — PX: COLONOSCOPY: SHX5424

## 2022-03-02 SURGERY — COLONOSCOPY
Anesthesia: General

## 2022-03-02 MED ORDER — MIDAZOLAM HCL 2 MG/2ML IJ SOLN
INTRAMUSCULAR | Status: AC
  Filled 2022-03-02: qty 2

## 2022-03-02 MED ORDER — GLYCOPYRROLATE 0.2 MG/ML IJ SOLN
INTRAMUSCULAR | Status: AC
  Filled 2022-03-02: qty 1

## 2022-03-02 MED ORDER — MIDAZOLAM HCL 2 MG/2ML IJ SOLN
INTRAMUSCULAR | Status: DC | PRN
  Administered 2022-03-02: 2 mg via INTRAVENOUS

## 2022-03-02 MED ORDER — LIDOCAINE HCL (PF) 2 % IJ SOLN
INTRAMUSCULAR | Status: AC
  Filled 2022-03-02: qty 5

## 2022-03-02 MED ORDER — SODIUM CHLORIDE 0.9 % IV SOLN: INTRAVENOUS | Status: DC

## 2022-03-02 MED ORDER — PROPOFOL 500 MG/50ML IV EMUL
INTRAVENOUS | Status: DC | PRN
  Administered 2022-03-02: 125 ug/kg/min via INTRAVENOUS

## 2022-03-02 MED ORDER — LIDOCAINE HCL (CARDIAC) PF 100 MG/5ML IV SOSY
PREFILLED_SYRINGE | INTRAVENOUS | Status: DC | PRN
  Administered 2022-03-02: 100 mg via INTRAVENOUS

## 2022-03-02 MED ORDER — PROPOFOL 10 MG/ML IV BOLUS
INTRAVENOUS | Status: DC | PRN
  Administered 2022-03-02: 30 mg via INTRAVENOUS
  Administered 2022-03-02: 100 mg via INTRAVENOUS
  Administered 2022-03-02: 30 mg via INTRAVENOUS

## 2022-03-02 MED ORDER — GLYCOPYRROLATE 0.2 MG/ML IJ SOLN
INTRAMUSCULAR | Status: DC | PRN
  Administered 2022-03-02 (×2): .2 mg via INTRAVENOUS

## 2022-03-02 MED ORDER — PROPOFOL 1000 MG/100ML IV EMUL
INTRAVENOUS | Status: AC
  Filled 2022-03-02: qty 100

## 2022-03-02 NOTE — H&P (Signed)
Outpatient short stay form Pre-procedure 03/02/2022 10:01 AM Priscilla Greene K. Priscilla Greene, M.D.  Primary Physician: Emily Filbert, M.D.  Reason for visit:  Pharyngeal dysphagia, personal history of adenomatous colon polyps > 5 yrs ago.  History of present illness:  Priscilla Greene was seen initially in the office for pharyngoesophageal dysphagia as well as FH and Personal hx of colon polyps. She is scheduled to have colonoscopy on 03/02/22. Barium swallow xray results are noted above. Patient today c/o side effects of Mestinon with fecal urgency with intermittent diarrhea. She notes great improvement in dizzy spells, however, and wants to stay on the drug. She has some nausea as well attributed to Mestinon which has her eating smaller meals. Dysphagia still occurs occasionally to solids in the mid-sternum.     Current Facility-Administered Medications:    0.9 %  sodium chloride infusion, , Intravenous, Continuous, Jasmon Mattice, Benay Pike, MD  Medications Prior to Admission  Medication Sig Dispense Refill Last Dose   albuterol (PROVENTIL HFA;VENTOLIN HFA) 108 (90 BASE) MCG/ACT inhaler Inhale 1-2 puffs into the lungs every 6 (six) hours as needed for wheezing or shortness of breath.   03/01/2022   aspirin 81 MG EC tablet Take 81 mg by mouth daily.     Past Week   Calcium Carb-Cholecalciferol 500-400 MG-UNIT TABS Take 1 tablet by mouth daily.   Past Week   Cholecalciferol (VITAMIN D3) 50 MCG (2000 UT) TABS Take by mouth daily.   Past Week   flecainide (TAMBOCOR) 50 MG tablet Take 50 mg by mouth every 12 (twelve) hours.     03/02/2022 at 0600   levothyroxine (SYNTHROID) 125 MCG tablet Take 125 mcg by mouth daily before breakfast.   03/02/2022 at 0200   MAGNESIUM GLYCINATE PLUS PO Take 120 mg by mouth daily.   Past Week   Multiple Vitamins-Minerals (AIRBORNE GUMMIES) CHEW Chew 1 Dose by mouth as needed.   Past Week   nadolol (CORGARD) 20 MG tablet Take 10 mg by mouth daily.    03/02/2022 at 0600   Probiotic Product  (PROBIOTIC PO) Take by mouth daily with breakfast.   Past Week   valACYclovir (VALTREX) 1000 MG tablet Take 2,000 mg by mouth 2 (two) times daily as needed.   Past Month   vitamin E 180 MG (400 UNITS) capsule Take 400 Units by mouth daily.   Past Week   acetaminophen (TYLENOL) 500 MG tablet Take 500 mg by mouth every 6 (six) hours as needed for pain.      diphenhydrAMINE (BENADRYL) 12.5 MG/5ML liquid Take 5 mLs by mouth as needed.      mupirocin ointment (BACTROBAN) 2 % Apply 1 Application topically as needed.      nitroGLYCERIN (NITROSTAT) 0.4 MG SL tablet Place 1 tablet under the tongue as directed.        Allergies  Allergen Reactions   Contrast Media [Iodinated Contrast Media] Shortness Of Breath, Rash and Anaphylaxis    Contrast dye   Vancomycin Anaphylaxis   Amoxicillin-Pot Clavulanate     Diarrhea, vomiting   Bystolic [Nebivolol Hcl] Nausea Only    tremors   Codeine     palpiataions   Effexor [Venlafaxine]     Wt loss, manic   Erythromycin     Vomiting,severe   Etanercept Other (See Comments)    Pt has been advised in the past to not take this medication    Lactose Intolerance (Gi) Diarrhea   Meperidine Hcl     palpitations   Paxil [Paroxetine]  Wt loss, manic   Plaquenil [Hydroxychloroquine] Diarrhea   Prozac [Fluoxetine Hcl] Other (See Comments)    Fever, weight loss, manic   Septra Ds [Sulfamethoxazole-Trimethoprim]    Tape     blisters   Topamax [Topiramate] Other (See Comments)    Syncope with memory loss     Past Medical History:  Diagnosis Date   Arthritis    Undifferentiated connective tissue disorder   Asthma    Atrial flutter (HCC)    post-operative following thyroidectomy   Cancer ([place])    THYROID CANCER   Duodenal ulcer 1986   DVT (deep venous thrombosis) ([place]) 2005   popliteal vein; questionable history of protein S deficiency   Encounter for interrogation of cardiac recorder    Loop recorder   Family history of adverse reaction to  anesthesia    Mother - PONV   GERD (gastroesophageal reflux disease)    Headache(784.0)    migraines   History of hypotension    Hypothyroidism    Motion sickness    cruise ships   Nonsustained ventricular tachycardia (HCC)    Palpitations    Hx of   PONV (postoperative nausea and vomiting)    Postoperative pulmonary embolism ([place])    Hx of   Pulmonary embolism ([place]) 1995   Following hysterectomy   Syncope    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    Physical Exam  Gen: Alert, oriented. Appears stated age.  HEENT: [place]/AT. PERRLA. Lungs: CTA, no wheezes. CV: RR nl S1, S2. Abd: soft, benign, no masses. BS+ Ext: No edema. Pulses 2+    Planned procedures: Proceed with EGD and colonoscopy. The patient understands the nature of the planned procedure, indications, risks, alternatives and potential complications including but not limited to bleeding, infection, perforation, damage to internal organs and possible oversedation/side effects from anesthesia. The patient agrees and gives consent to proceed.  Please refer to procedure notes for findings, recommendations and patient disposition/instructions.     Madasyn Heath K. Priscilla Greene, M.D. Gastroenterology 03/02/2022  10:01 AM

## 2022-03-02 NOTE — Op Note (Signed)
[facility] Gastroenterology Patient Name: Priscilla Greene Procedure Date: 03/02/2022 10:02 AM MRN: [id number] Account #: [id number] Date of Birth: 1965-09-05 Admit Type: Outpatient Age: 57 Room: [facility] ENDO ROOM 2 Gender: Female Note Status: Finalized Instrument Name: Upper Endoscope [id number] Procedure:             Upper GI endoscopy Indications:           Esophageal dysphagia Providers:             Lorie Apley K. Alice Reichert MD, MD Referring MD:          Rusty Aus, MD (Referring MD) Medicines:             Propofol per Anesthesia Complications:         No immediate complications. Procedure:             Pre-Anesthesia Assessment:                        - The risks and benefits of the procedure and the                         sedation options and risks were discussed with the                         patient. All questions were answered and informed                         consent was obtained.                        - Patient identification and proposed procedure were                         verified prior to the procedure by the nurse. The                         procedure was verified in the procedure room.                        - ASA Grade Assessment: III - A patient with severe                         systemic disease.                        - After reviewing the risks and benefits, the patient                         was deemed in satisfactory condition to undergo the                         procedure.                        After obtaining informed consent, the endoscope was                         passed under direct vision. Throughout the procedure,  the patient's blood pressure, pulse, and oxygen                         saturations were monitored continuously. The Endoscope                         was introduced through the mouth, and advanced to the                         third part of duodenum. The upper GI endoscopy was                          accomplished without difficulty. The patient tolerated                         the procedure well. Findings:      No endoscopic abnormality was evident in the esophagus to explain the       patient's complaint of dysphagia. It was decided, however, to proceed       with dilation in the distal esophagus. The scope was withdrawn. Dilation       was performed with a Maloney dilator with no resistance at 48 Fr.      The stomach was normal.      The examined duodenum was normal. Impression:            - No endoscopic esophageal abnormality to explain                         patient's dysphagia. Esophagus dilated. Dilated.                        - Normal stomach.                        - Normal examined duodenum.                        - No specimens collected. Recommendation:        - Monitor results to esophageal dilation                        - Proceed with colonoscopy Procedure Code(s):     --- Professional ---                        [phone], Esophagogastroduodenoscopy, flexible,                         transoral; diagnostic, including collection of                         specimen(s) by brushing or washing, when performed                         (separate procedure)                        43450, Dilation of esophagus, by unguided sound or                         bougie, single or multiple passes Diagnosis Code(s):     ---  Professional ---                        R13.14, Dysphagia, pharyngoesophageal phase CPT copyright 2022 American Medical Association. All rights reserved. The codes documented in this report are preliminary and upon coder review may  be revised to meet current compliance requirements. Efrain Sella MD, MD 03/02/2022 10:22:26 AM This report has been signed electronically. Number of Addenda: 0 Note Initiated On: 03/02/2022 10:02 AM Estimated Blood Loss:  Estimated blood loss: none.      [facility]

## 2022-03-02 NOTE — Op Note (Signed)
[facility] Gastroenterology Patient Name: Priscilla Greene Procedure Date: 03/02/2022 9:59 AM MRN: [id number] Account #: [id number] Date of Birth: 30-Jul-1965 Admit Type: Outpatient Age: 57 Room: [facility] ENDO ROOM 2 Gender: Female Note Status: Finalized Instrument Name: Jasper Riling [id number] Procedure:             Colonoscopy Indications:           Surveillance: Personal history of adenomatous polyps                         on last colonoscopy > 5 years ago Providers:             Lorie Apley K. Alice Reichert MD, MD Referring MD:          Rusty Aus, MD (Referring MD) Medicines:             Propofol per Anesthesia Complications:         No immediate complications. Procedure:             Pre-Anesthesia Assessment:                        - The risks and benefits of the procedure and the                         sedation options and risks were discussed with the                         patient. All questions were answered and informed                         consent was obtained.                        - Patient identification and proposed procedure were                         verified prior to the procedure by the nurse. The                         procedure was verified in the procedure room.                        - ASA Grade Assessment: III - A patient with severe                         systemic disease.                        - After reviewing the risks and benefits, the patient                         was deemed in satisfactory condition to undergo the                         procedure.                        After obtaining informed consent, the colonoscope was  passed under direct vision. Throughout the procedure,                         the patient's blood pressure, pulse, and oxygen                         saturations were monitored continuously. The                         Colonoscope was introduced through the anus and                          advanced to the the cecum, identified by appendiceal                         orifice and ileocecal valve. The colonoscopy was                         performed without difficulty. The patient tolerated                         the procedure well. The quality of the bowel                         preparation was good. The ileocecal valve, appendiceal                         orifice, and rectum were photographed. Findings:      The perianal and digital rectal examinations were normal. Pertinent       negatives include normal sphincter tone and no palpable rectal lesions.      Non-bleeding internal hemorrhoids were found during retroflexion. The       hemorrhoids were Grade I (internal hemorrhoids that do not prolapse).      A few small-mouthed diverticula were found in the sigmoid colon.      The exam was otherwise without abnormality. Impression:            - Non-bleeding internal hemorrhoids.                        - Diverticulosis in the sigmoid colon.                        - The examination was otherwise normal.                        - No specimens collected. Recommendation:        - Patient has a contact number available for                         emergencies. The signs and symptoms of potential                         delayed complications were discussed with the patient.                         Return to normal activities tomorrow. Written  discharge instructions were provided to the patient.                        - Resume previous diet.                        - Continue present medications.                        - Repeat colonoscopy in 5 years for screening purposes.                        - Monitor results to esophageal dilation                        - Return to physician assistant in 3 months.                        - Family history of colon polyps in a first degree                         relative.                        - The findings and  recommendations were discussed with                         the patient. Procedure Code(s):     --- Professional ---                        [id number], Colorectal cancer screening; colonoscopy on                         individual at high risk Diagnosis Code(s):     --- Professional ---                        K57.30, Diverticulosis of large intestine without                         perforation or abscess without bleeding                        K64.0, First degree hemorrhoids                        Z86.010, Personal history of colonic polyps CPT copyright 2022 American Medical Association. All rights reserved. The codes documented in this report are preliminary and upon coder review may  be revised to meet current compliance requirements. Efrain Sella MD, MD 03/02/2022 10:37:09 AM This report has been signed electronically. Number of Addenda: 0 Note Initiated On: 03/02/2022 9:59 AM Scope Withdrawal Time: 0 hours 6 minutes 4 seconds  Total Procedure Duration: 0 hours 9 minutes 5 seconds  Estimated Blood Loss:  Estimated blood loss: none.      [facility]

## 2022-03-02 NOTE — Transfer of Care (Signed)
Immediate Anesthesia Transfer of Care Note  Patient: Priscilla Greene  Procedure(s) Performed: COLONOSCOPY ESOPHAGOGASTRODUODENOSCOPY (EGD)  Patient Location: PACU  Anesthesia Type:General  Level of Consciousness: drowsy  Airway & Oxygen Therapy: Patient Spontanous Breathing  Post-op Assessment: Report given to RN and Post -op Vital signs reviewed and stable  Post vital signs: Reviewed and stable  Last Vitals:  Vitals Value Taken Time  BP 128/70 03/02/22 1036  Temp 35.6 C 03/02/22 1036  Pulse 71 03/02/22 1037  Resp 17 03/02/22 1037  SpO2 98 % 03/02/22 1037  Vitals shown include unvalidated device data.  Last Pain:  Vitals:   03/02/22 1036  TempSrc: Temporal  PainSc: Asleep         Complications: No notable events documented.

## 2022-03-02 NOTE — Anesthesia Preprocedure Evaluation (Signed)
Anesthesia Evaluation  Patient identified by MRN, date of birth, ID band Patient awake    Reviewed: Allergy & Precautions, H&P , NPO status , Patient's Chart, lab work & pertinent test results  History of Anesthesia Complications (+) PONV and history of anesthetic complications  Airway Mallampati: III  TM Distance: >3 FB Neck ROM: full    Dental  (+) Poor Dentition, Chipped, Missing, Partial Upper   Pulmonary neg shortness of breath, asthma , neg recent URI          Cardiovascular Exercise Tolerance: Good hypertension, (-) angina (-) Past MI and (-) DOE (-) dysrhythmias      Neuro/Psych  Headaches TIAnegative neurological ROS  negative psych ROS   GI/Hepatic negative GI ROS, Neg liver ROS, PUD,GERD  Medicated and Controlled,,  Endo/Other  neg diabetesHypothyroidism    Renal/GU negative Renal ROS  negative genitourinary   Musculoskeletal  (+) Arthritis ,    Abdominal   Peds  Hematology negative hematology ROS (+)   Anesthesia Other Findings Past Medical History: No date: Arthritis     Comment:  rheumatoid No date: Asthma No date: Atrial flutter (HCC) No date: Cancer ([place])     Comment:  THYROID CANCER 1986: Duodenal ulcer No date: Encounter for interrogation of cardiac recorder     Comment:  Loop recorder No date: GERD (gastroesophageal reflux disease) No date: Headache(784.0)     Comment:  migraines No date: History of hypotension No date: Nonsustained ventricular tachycardia (HCC) No date: Palpitations     Comment:  Hx of No date: PONV (postoperative nausea and vomiting) No date: Postoperative pulmonary embolism (HCC)     Comment:  Hx of No date: Syncope 10/18/2012: Thyroid cancer ([place])     Comment:  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[place])     Comment:  papillary 2015: TIA (transient ischemic attack) No date: White coat hypertension  Past Surgical History: No date: ABDOMINAL  HYSTERECTOMY No date: CARDIAC ELECTROPHYSIOLOGY MAPPING AND ABLATION No date: DIAGNOSTIC LAPAROSCOPY No date: LAPAROSCOPY No date: LEFT OOPHORECTOMY No date: LOOP RECORDER     Comment:  Implanted 2000, removed 2002 10/18/2012: THYROID LOBECTOMY; Bilateral     Comment:  Procedure: total thyroidectomy with limited central               compartment lympy node disection;  Surgeon: Earnstine Regal, MD;  Location: [facility] ORS;  Service: General;                Laterality: Bilateral; No date: TONSILLECTOMY No date: VULVECTOMY     Comment:  Partial     Reproductive/Obstetrics negative OB ROS                             Anesthesia Physical Anesthesia Plan  ASA: 4  Anesthesia Plan: General   Post-op Pain Management:    Induction: Intravenous  PONV Risk Score and Plan: Propofol infusion, TIVA and Treatment may vary due to age or medical condition  Airway Management Planned: Natural Airway and Nasal Cannula  Additional Equipment:   Intra-op Plan:   Post-operative Plan:   Informed Consent: I have reviewed the patients History and Physical, chart, labs and discussed the procedure including the risks, benefits and alternatives for the proposed anesthesia with the patient or authorized representative who has indicated his/her understanding and acceptance.     Dental Advisory Given  Plan Discussed  with: Anesthesiologist, CRNA and Surgeon  Anesthesia Plan Comments: (Patient informed that they are higher risk for complications from anesthesia during this procedure due to their medical history.  Patient voiced understanding.  Patient consented for risks of anesthesia including but not limited to:  - adverse reactions to medications - risk of intubation if required - damage to teeth, lips or other oral mucosa - sore throat or hoarseness - Damage to heart, brain, lungs or loss of life  Patient voiced understanding.)        Anesthesia Quick  Evaluation

## 2022-03-03 ENCOUNTER — Encounter: Payer: Self-pay | Admitting: Internal Medicine

## 2022-03-03 NOTE — Anesthesia Postprocedure Evaluation (Signed)
Anesthesia Post Note  Patient: Priscilla Greene  Procedure(s) Performed: COLONOSCOPY ESOPHAGOGASTRODUODENOSCOPY (EGD)  Patient location during evaluation: Endoscopy Anesthesia Type: General Level of consciousness: awake and alert Pain management: pain level controlled Vital Signs Assessment: post-procedure vital signs reviewed and stable Respiratory status: spontaneous breathing, nonlabored ventilation, respiratory function stable and patient connected to nasal cannula oxygen Cardiovascular status: blood pressure returned to baseline and stable Postop Assessment: no apparent nausea or vomiting Anesthetic complications: no   No notable events documented.   Last Vitals:  Vitals:   03/02/22 1036 03/02/22 1056  BP: 128/70   Pulse: 72   Resp: 16   Temp: (!) 35.6 C   SpO2: 99% 100%    Last Pain:  Vitals:   03/03/22 0722  TempSrc:   PainSc: 0-No pain                 Martha Clan

## 2022-04-20 ENCOUNTER — Ambulatory Visit: Payer: BC Managed Care – PPO | Admitting: Internal Medicine

## 2022-05-19 ENCOUNTER — Ambulatory Visit: Payer: BC Managed Care – PPO | Admitting: Internal Medicine

## 2022-06-28 ENCOUNTER — Encounter: Payer: Self-pay | Admitting: Internal Medicine

## 2022-06-29 ENCOUNTER — Other Ambulatory Visit: Payer: Self-pay | Admitting: *Deleted

## 2022-06-29 MED ORDER — FLECAINIDE ACETATE 50 MG PO TABS: 50.0000 mg | ORAL_TABLET | Freq: Two times a day (BID) | ORAL | 3 refills | Status: AC

## 2022-06-29 NOTE — Telephone Encounter (Signed)
Please refill patient's prescription for flecainide 50 mg twice daily as written in her request.  Thanks.  [name], MD [facility]

## 2022-07-13 ENCOUNTER — Ambulatory Visit: Payer: BC Managed Care – PPO | Attending: Internal Medicine | Admitting: Internal Medicine

## 2022-07-13 ENCOUNTER — Encounter: Payer: Self-pay | Admitting: Internal Medicine

## 2022-07-13 VITALS — BP 110/78 | HR 56 | Ht 64.0 in | Wt 156.2 lb

## 2022-07-13 DIAGNOSIS — R600 Localized edema: Secondary | ICD-10-CM | POA: Insufficient documentation

## 2022-07-13 DIAGNOSIS — R55 Syncope and collapse: Secondary | ICD-10-CM | POA: Diagnosis not present

## 2022-07-13 DIAGNOSIS — I4729 Other ventricular tachycardia: Secondary | ICD-10-CM

## 2022-07-13 DIAGNOSIS — G909 Disorder of the autonomic nervous system, unspecified: Secondary | ICD-10-CM | POA: Diagnosis not present

## 2022-07-13 DIAGNOSIS — E7800 Pure hypercholesterolemia, unspecified: Secondary | ICD-10-CM

## 2022-07-13 DIAGNOSIS — I493 Ventricular premature depolarization: Secondary | ICD-10-CM | POA: Diagnosis not present

## 2022-07-13 MED ORDER — HYDROCHLOROTHIAZIDE 25 MG PO TABS: 25.0000 mg | ORAL_TABLET | Freq: Every day | ORAL | 3 refills | Status: DC | PRN

## 2022-07-13 MED ORDER — NITROGLYCERIN 0.4 MG SL SUBL: 0.4000 mg | SUBLINGUAL_TABLET | SUBLINGUAL | 0 refills | Status: DC

## 2022-07-13 MED ORDER — NITROGLYCERIN 0.4 MG SL SUBL: 0.4000 mg | SUBLINGUAL_TABLET | SUBLINGUAL | 0 refills | Status: AC

## 2022-07-13 NOTE — Patient Instructions (Signed)
Medication Instructions:   Start Hydrochlorothiazide 25 mg daily as needed, may take 1/2 tablet or 1 tablet based off swelling    *If you need a refill on your cardiac medications before your next appointment, please call your pharmacy*   Lab Work: none If you have labs (blood work) drawn today and your tests are completely normal, you will receive your results only by: MyChart Message (if you have MyChart) OR A paper copy in the mail If you have any lab test that is abnormal or we need to change your treatment, we will call you to review the results.   Testing/Procedures: Your physician has requested that you have an echocardiogram. Echocardiography is a painless test that uses sound waves to create images of your heart. It provides your doctor with information about the size and shape of your heart and how well your heart's chambers and valves are working.   You may receive an ultrasound enhancing agent through an IV if needed to better visualize your heart during the echo. This procedure takes approximately one hour.  There are no restrictions for this procedure.  This will take place at [address] ([address], [place] [address]   Your physician has requested that you have a carotid duplex. This test is an ultrasound of the carotid arteries in your neck. It looks at blood flow through these arteries that supply the brain with blood.   Allow one hour for this exam.  There are no restrictions or special instructions.  This will take place at [address] ([address], [place] [address]    Follow-Up: At [facility], you and your health needs are our priority.  As part of our continuing mission to provide you with exceptional heart care, we have created designated Provider Care Teams.  These Care Teams include your primary Cardiologist (physician) and Advanced Practice Providers (APPs -  Physician Assistants and Nurse  Practitioners) who all work together to provide you with the care you need, when you need it.  We recommend signing up for the patient portal called "MyChart".  Sign up information is provided on this After Visit Summary.  MyChart is used to connect with patients for Virtual Visits (Telemedicine).  Patients are able to view lab/test results, encounter notes, upcoming appointments, etc.  Non-urgent messages can be sent to your provider as well.   To learn more about what you can do with MyChart, go to [email or web address].    Your next appointment:   6 week(s)  Provider:   You may see [name], MD or one of the following Advanced Practice Providers on your designated Care Team:   Nicolasa Ducking, NP Eula Listen, PA-C Cadence Fransico Michael, PA-C Charlsie Quest, NP

## 2022-07-13 NOTE — Progress Notes (Signed)
Cardiology Office Note:  .   Date:  07/13/2022  ID:  Priscilla Greene, DOB [birth year], MRN [id number] PCP: Danella Penton, MD  [facility] Providers Cardiologist:  [name], MD     History of Present Illness: .   Priscilla Greene is a 57 y.o. 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  She presents today for follow-up of her arrhythmias, having last been seen in October at which time she transitioned her care from Dr. Lady Gary to our clinic.  She was recovering from COVID-19 at that time.  She noted that her palpitations were generally well-controlled with nadolol and flecainide.  She was seen in January by Dr. Melvyn Neth in the dysautonomia clinic at [facility], at which time pyridostigmine was added to her regimen of nadolol and flecainide due to a syncopal episode and November that led to a left ankle fracture.  Today, Priscilla Greene is concerned about continued syncopal episodes.  Since her fall and left ankle fracture in November, she has had 2 more syncopal episodes at church.  She notes that this is not uncommon for her.  Unfortunately, she did not tolerate pyridostigmine due to GI side effects, though she notes it seemed to help her dizziness somewhat.  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  She reports a few episodes of chest pain, having had to take nitroglycerin just once.  She denies significant dyspnea.  She is concerned about intermittent edema particularly when traveling.  She is scheduled to go on a [place] cruise in September and would like to have a strategy in place to deal with edema retention,  and dizziness.  She had been prescribed HCTZ in the past by Dr. Hyacinth Meeker to take on an as-needed basis for leg swelling, though she has never used this.  ROS: See HPI  Studies Reviewed: Marland Kitchen   EKG Interpretation Date/Time:  Wednesday July 13 2022 10:02:34 EDT Ventricular Rate:  56 PR Interval:  154 QRS Duration:  82 QT Interval:  430 QTC Calculation: 414 R Axis:   12  Text Interpretation: Sinus bradycardia When compared with ECG of 21-Oct-2021 No significant change was found Confirmed by Jonmichael Beadnell [phone]) on 07/13/2022 10:22:00 AM    EP Procedures and Devices: Tilt table test (06/19/2014): Inappropriate sinus tachycardia.   Non-Invasive Evaluation(s): Pharmacologic MPI (05/03/2022, [facility]): Low risk study without ischemia or scar.  LVEF 61%. Pharmacologic MPI (08/01/2019): Normal study without evidence of ischemia or scar.  LVEF 57%. Coronary CTA (05/22/2019): Normal origin of coronary arteries without atherosclerotic disease or coronary calcification. TTE (05/15/2019): Normal biventricular systolic function.  Mild mitral and tricuspid regurgitation.  Trivial aortic regurgitation. Cardiac MRI (06/13/2017): LVEF 65% with normal wall motion.  Normal LV size and wall thickness.  Normal RV size and function.  Normal biatrial size.  No significant valvular disease.  No abnormal delayed hyperenhancement.  Risk Assessment/Calculations:            Physical Exam:   VS:  BP 110/78 (BP Location: Left Arm, Patient Position: Sitting, Cuff Size: Normal)   Pulse (!) 56   Ht 5\' 4"  (1.626 m)   Wt 156 lb 4 oz (70.9 kg)   SpO2 98%  BMI 26.82 kg/m    Wt Readings from Last 3 Encounters:  07/13/22 156 lb 4 oz (70.9 kg)  03/02/22 152 lb (68.9 kg)  10/21/21 154 lb (69.9 kg)    General:  NAD. Neck: No JVD or HJR. Lungs: Clear to auscultation bilaterally without wheezes or crackles. Heart: Regular rate and rhythm without murmurs, rubs, or gallops. Abdomen: Soft, nontender, nondistended. Extremities:  Trace pretibial edema bilaterally.  ASSESSMENT AND PLAN: .    Dysautonomia, syncope, and inappropriate sinus tachycardia: This has been a longstanding issue for admits and is comanaged with Dr. Melvyn Neth at [facility].  Unfortunately, Ms. Cirrincione did not tolerate addition of pyridostigmine.  We discussed importance of maintaining good hydration and use of compressive garments to help with minimizing her orthostatic symptoms.  We will repeat an echocardiogram and also obtain carotid Dopplers at her convenience.  PVCs and nonsustained ventricular tachycardia: Minimal palpitations noted.  Continue current doses of nadolol and flecainide at the direction of Dr. Melvyn Neth.  Leg edema: This is very much diet related, particularly with sodium intake.  Given concerns about increased sodium intake on her upcoming cruise, we have agreed to try HCTZ 12.5-25 mg daily as needed for edema.  I have encouraged her to try this if she notices any swelling prior to leaving on her trip in order to see how she tolerates the medication.  Given that it has been over 4 years since her last echocardiogram, we will repeat an echocardiogram at her convenience.  Hyperlipidemia: LDL markedly elevated on last check earlier this month at 200 consistent with heterozygous familial hyperlipidemia.  She is reluctant to take a statin, though we will need to readdress this at follow-up.    Dispo: Return to clinic in ~6 weeks.  Signed, [name], MD

## 2022-07-18 MED ORDER — HYDROCHLOROTHIAZIDE 25 MG PO TABS: ORAL_TABLET | ORAL | 3 refills | Status: DC

## 2022-07-18 NOTE — Addendum Note (Signed)
Addended by: Parke Poisson on: 07/18/2022 09:11 AM   Modules accepted: Orders

## 2022-07-20 ENCOUNTER — Telehealth: Payer: Self-pay | Admitting: Internal Medicine

## 2022-07-20 MED ORDER — HYDROCHLOROTHIAZIDE 25 MG PO TABS: ORAL_TABLET | ORAL | 3 refills | Status: DC

## 2022-07-20 NOTE — Telephone Encounter (Signed)
[facility] called to clarify hydrochlorothiazide directions. Per chart review, Dr. Okey Dupre recommended pt take 12.5 -25 mg daily as needed for swelling.  [facility] pharmacy made aware

## 2022-07-20 NOTE — Telephone Encounter (Signed)
Pt c/o medication issue:  1. Name of Medication: hydrochlorothiazide (HYDRODIURIL) 25 MG tablet   2. How are you currently taking this medication (dosage and times per day)?   3. Are you having a reaction (difficulty breathing--STAT)?   4. What is your medication issue? [facility] called to clarify directions for this medication.  Reference # [id number]

## 2022-08-09 ENCOUNTER — Other Ambulatory Visit: Payer: Self-pay | Admitting: Physician Assistant

## 2022-08-09 DIAGNOSIS — M5416 Radiculopathy, lumbar region: Secondary | ICD-10-CM

## 2022-08-23 ENCOUNTER — Other Ambulatory Visit: Payer: BC Managed Care – PPO

## 2022-08-26 ENCOUNTER — Ambulatory Visit: Payer: BC Managed Care – PPO | Attending: Internal Medicine

## 2022-08-26 ENCOUNTER — Ambulatory Visit (INDEPENDENT_AMBULATORY_CARE_PROVIDER_SITE_OTHER): Payer: BC Managed Care – PPO

## 2022-08-26 DIAGNOSIS — R55 Syncope and collapse: Secondary | ICD-10-CM | POA: Diagnosis not present

## 2022-08-27 LAB — ECHOCARDIOGRAM COMPLETE
AR max vel: 2.22 cm2
AV Area VTI: 2.22 cm2
AV Area mean vel: 2.22 cm2
AV Mean grad: 4 mmHg
AV Peak grad: 7.1 mmHg
Ao pk vel: 1.33 m/s
Area-P 1/2: 4.15 cm2
Calc EF: 54.7 %
S' Lateral: 3.3 cm
Single Plane A2C EF: 57.2 %
Single Plane A4C EF: 53.7 %

## 2022-08-30 ENCOUNTER — Ambulatory Visit: Admission: RE | Admit: 2022-08-30 | Payer: BC Managed Care – PPO | Source: Ambulatory Visit

## 2022-08-30 DIAGNOSIS — M5416 Radiculopathy, lumbar region: Secondary | ICD-10-CM

## 2022-08-31 ENCOUNTER — Encounter: Payer: Self-pay | Admitting: Internal Medicine

## 2022-08-31 ENCOUNTER — Ambulatory Visit: Payer: BC Managed Care – PPO | Attending: Internal Medicine | Admitting: Internal Medicine

## 2022-08-31 VITALS — BP 120/70 | HR 69 | Ht 64.0 in | Wt 148.2 lb

## 2022-08-31 DIAGNOSIS — I493 Ventricular premature depolarization: Secondary | ICD-10-CM | POA: Diagnosis not present

## 2022-08-31 DIAGNOSIS — E7800 Pure hypercholesterolemia, unspecified: Secondary | ICD-10-CM

## 2022-08-31 DIAGNOSIS — R600 Localized edema: Secondary | ICD-10-CM

## 2022-08-31 DIAGNOSIS — R0602 Shortness of breath: Secondary | ICD-10-CM

## 2022-08-31 DIAGNOSIS — I361 Nonrheumatic tricuspid (valve) insufficiency: Secondary | ICD-10-CM

## 2022-08-31 DIAGNOSIS — I4729 Other ventricular tachycardia: Secondary | ICD-10-CM | POA: Diagnosis not present

## 2022-08-31 DIAGNOSIS — G909 Disorder of the autonomic nervous system, unspecified: Secondary | ICD-10-CM

## 2022-08-31 DIAGNOSIS — G4730 Sleep apnea, unspecified: Secondary | ICD-10-CM

## 2022-08-31 NOTE — Progress Notes (Signed)
Cardiology Office Note:  .   Date:  08/31/2022  ID:  Priscilla Greene, DOB [birth year], MRN [id number] PCP: Danella Penton, MD  [facility] Providers Cardiologist:  [name], MD     History of Present Illness: .   Priscilla Greene is a 57 y.o. 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  I met her last month, at which time Priscilla Greene was concerned about continued syncopal episodes.  Subsequent echo showed preserved LVEF with mild mitral and mild to moderate tricuspid regurgitation.  Carotid Dopplers were normal.  Priscilla Greene was also concerned about intermittent edema and potential for worsening swelling and shortness of breath during an upcoming [place].  She tried transiently increasing her sodium intake and then using HCTZ to ensure that she would tolerate this.  She did fine with the HCTZ and noted good diuresis after 1 dose.  Today, Priscilla Greene reports that she has been feeling fairly well.  She still has occasional dizziness but has not passed out anymore.  She notes sporadic palpitations that she attributes to PVCs, which seem to worsen when she tried taking celecoxib.  She has not used this anymore.  She denies chest pain.  She is concerned about her recent echocardiogram demonstrating mild to moderate tricuspid regurgitation.  She notes a history of some lung nodules that have been followed by Dr. Meredeth Ide due to her history of thyroid cancer.  However, she has not seen him in a couple of years.  She has never been checked for sleep apnea though notes that her husband has mentioned some disordered breathing while she sleeps.  She also has occasional PND and reports that her smart watch  mentions somewhat low oxygen levels at night.  She does not think that she snores.  ROS: See HPI  Studies Reviewed: .        Carotid Doppler (08/26/2022): Normal study without evidence of carotid artery stenosis.  Antegrade flow in both vertebral arteries.  Normal flow hemodynamics in both subclavian arteries.  TTE (08/26/2022): Normal LV size and wall thickness.  LVEF 60-65% with normal wall motion and diastolic function.  Normal global longitudinal strain (-18.8%).  Normal RV size and function.  Normal PA pressure.  Normal biatrial size.  No pericardial effusion.  Normal mitral valve with mild regurgitation.  Normal tricuspid valve with mild to moderate regurgitation.  Tricuspid aortic valve without stenosis or regurgitation.  Normal CVP.  Risk Assessment/Calculations:             Physical Exam:   VS:  BP 120/70 (BP Location: Left Arm, Patient Position: Sitting, Cuff Size: Normal)   Pulse 69   Ht 5\' 4"  (1.626 m)   Wt 148 lb 4 oz (67.2 kg)   SpO2 98%   BMI 25.45 kg/m    Wt Readings from Last 3 Encounters:  08/31/22 148 lb 4 oz (67.2 kg)  07/13/22 156 lb 4 oz (70.9 kg)  03/02/22 152 lb (68.9 kg)    General:  NAD. Neck: No JVD or HJR. Lungs: Clear to auscultation bilaterally without wheezes or crackles. Heart: Regular rate and rhythm without murmurs, rubs, or gallops. Abdomen: Soft, nontender, nondistended. Extremities: Trace ankle edema bilaterally.  ASSESSMENT AND PLAN: .    Dysautonomia: No  further syncope reported though [name] continues to have some intermittent orthostatic lightheadedness.  I encouraged her to stay well-hydrated.  I also encourage compression stocking use, particularly when she flies to [place] in a few weeks.  PVCs and nonsustained ventricular tachycardia: PVCs have been fairly quiescent.  Continue nadolol and flecainide with ongoing follow-up through electrophysiology at [facility].  Leg edema: Minimal edema noted today.  [name] tolerated low-dose  HCTZ for edema since our last visit.  She can continue to use this if she has significant edema or weight gain.  Tricuspid regurgitation, shortness of breath, and sleep disordered breathing: Mild to moderate on recent echo.  Given report of sleep disordered breathing as well as edema and mild shortness of breath, I will refer [name] to pulmonology for consideration of a sleep study.  Hyperlipidemia: LDL quite elevated on last check.  [name] attributes her elevated cholesterol to familial link as well as some unhealthy snacks.  She has been very mindful of her diet and is planning to have a repeat lipid panel through Dr. Rondel Baton office next week.  She notes having been intolerant to multiple statins in the past.  We will follow-up with her after her blood draw next week and consider adding a PCSK9 inhibitor if her LDL remains severely elevated.    Dispo: Return to clinic in 6 months.  Signed, [name], MD

## 2022-08-31 NOTE — Patient Instructions (Signed)

## 2022-09-02 ENCOUNTER — Telehealth: Payer: Self-pay | Admitting: Internal Medicine

## 2022-09-02 ENCOUNTER — Encounter: Payer: Self-pay | Admitting: Internal Medicine

## 2022-09-02 DIAGNOSIS — I361 Nonrheumatic tricuspid (valve) insufficiency: Secondary | ICD-10-CM | POA: Insufficient documentation

## 2022-09-02 DIAGNOSIS — R0602 Shortness of breath: Secondary | ICD-10-CM | POA: Insufficient documentation

## 2022-09-02 DIAGNOSIS — G4730 Sleep apnea, unspecified: Secondary | ICD-10-CM | POA: Insufficient documentation

## 2022-09-02 NOTE — Telephone Encounter (Signed)
Discussed elevated lipids noted on prior blood draws with Ms. Priscilla Greene.  She is scheduled for repeat lipid panel through Dr. Hyacinth Meeker next week.  We will follow-up with her after reviewing these results to discuss need for pharmacotherapy (likely PCSK9 inhibitor given intolerance of multiple statins in the past).  [name], MD [facility]

## 2022-09-02 NOTE — Telephone Encounter (Signed)
Pt returning the Dr's call. Please advise

## 2022-09-05 ENCOUNTER — Encounter: Payer: Self-pay | Admitting: Internal Medicine

## 2023-03-02 ENCOUNTER — Ambulatory Visit: Payer: BC Managed Care – PPO | Admitting: Internal Medicine

## 2023-05-11 ENCOUNTER — Ambulatory Visit: Payer: Self-pay | Attending: Internal Medicine | Admitting: Internal Medicine

## 2023-05-11 VITALS — BP 138/82 | HR 56 | Ht 64.0 in | Wt 161.0 lb

## 2023-05-11 DIAGNOSIS — I4729 Other ventricular tachycardia: Secondary | ICD-10-CM | POA: Diagnosis not present

## 2023-05-11 DIAGNOSIS — I493 Ventricular premature depolarization: Secondary | ICD-10-CM

## 2023-05-11 DIAGNOSIS — R079 Chest pain, unspecified: Secondary | ICD-10-CM

## 2023-05-11 DIAGNOSIS — E7800 Pure hypercholesterolemia, unspecified: Secondary | ICD-10-CM

## 2023-05-11 DIAGNOSIS — R600 Localized edema: Secondary | ICD-10-CM | POA: Diagnosis not present

## 2023-05-11 DIAGNOSIS — R0602 Shortness of breath: Secondary | ICD-10-CM

## 2023-05-11 DIAGNOSIS — G909 Disorder of the autonomic nervous system, unspecified: Secondary | ICD-10-CM

## 2023-05-11 MED ORDER — FUROSEMIDE 20 MG PO TABS: 10.0000 mg | ORAL_TABLET | Freq: Every day | ORAL | 3 refills | Status: AC | PRN

## 2023-05-11 MED ORDER — FUROSEMIDE 20 MG PO TABS: 10.0000 mg | ORAL_TABLET | Freq: Every day | ORAL | 3 refills | Status: DC | PRN

## 2023-05-11 NOTE — Progress Notes (Unsigned)
 Cardiology Office Note:  .   Date:  05/11/2023  ID:  Priscilla Greene, DOB 05/15/1965, MRN [id number] PCP: Sari Cunning, MD  [facility] Providers Cardiologist:  Sammy Crisp, MD { Click to update primary MD,subspecialty MD or APP then REFRESH:1}    History of Present Illness: .   Priscilla Greene is a 58 y.o. 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  I last saw her in 08/2022, at which time she was feeling fairly well.  She was seen in the dysautonomia clinic at [facility] in January after a syncopal episode about 3 weeks earlier.  She also made note of atypical chest pain at that time.  Decision was made to discontinue flecainide  given absence of documented arrhythmias.  Swelling with second cruise.  13 pounds of fluid.  Never took hydrochlorothiazide .  Gives diarrhea as well.  Was tight in chest with short of breath.  BP 197/102; took NTG to relieve tightness -> helped.  NSVT many years ago -> flecainide  by Dr. Rodolfo Clan.  Told needs to come off.  Outside CT chest mild CAC.  Uses hydrochlorothiazide  rarely.  Getting over asthma flare.  Dizziness/LH stable.  Syncope in January putting up things in garage.  Also passed out taking dog to potty; leaned over and fainted 2 months ago.  ROS: See HPI  Studies Reviewed: Aaron Aas   EKG Interpretation Date/Time:  Thursday May 11 2023 09:22:52 EDT Ventricular Rate:  51 PR Interval:  152 QRS Duration:  86 QT Interval:  450 QTC Calculation: 414 R Axis:   10  Text Interpretation: Sinus bradycardia Otherwise normal ECG When compared with ECG of 13-Jul-2022 10:02, No significant change was found Confirmed by Lannette Avellino [phone]) on 05/11/2023  9:27:11 AM    Carotid Doppler (08/26/2022): Normal study without evidence of carotid artery stenosis.  Antegrade flow in both vertebral arteries.  Normal flow hemodynamics in both subclavian arteries.   TTE (08/26/2022): Normal LV size and wall thickness.  LVEF 60-65% with normal wall motion and diastolic function.  Normal global longitudinal strain (-18.8%).  Normal RV size and function.  Normal PA pressure.  Normal biatrial size.  No pericardial effusion.  Normal mitral valve with mild regurgitation.  Normal tricuspid valve with mild to moderate regurgitation.  Tricuspid aortic valve without stenosis or regurgitation.  Normal CVP.  Pharmacologic MPI (05/03/2022, [facility]): Normal study without evidence of ischemia or scar.  LVEF 61%.  Risk Assessment/Calculations:   {Does this patient have ATRIAL FIBRILLATION?:[phone]}         Physical Exam:   VS:  BP 138/82 (BP Location: Left Arm, Patient Position: Sitting, Cuff Size: Normal)   Pulse (!) 56   Ht 5\' 4"  (1.626 m)   Wt 161 lb (73 kg)   SpO2 96%   BMI 27.64 kg/m    Wt Readings from Last 3 Encounters:  05/11/23 161 lb (73 kg)  08/31/22 148 lb 4 oz (67.2 kg)  07/13/22 156 lb 4 oz (70.9 kg)    General:  NAD. Neck: No JVD or HJR. Lungs: Clear to auscultation bilaterally without wheezes or crackles. Heart: Regular rate and rhythm without murmurs, rubs, or gallops. Abdomen: Soft, nontender, nondistended. Extremities: No lower extremity edema.  ASSESSMENT AND PLAN: .    ***    {  Are you ordering a CV Procedure (e.g. stress test, cath, DCCV, TEE, etc)?   Press F2        :[id number]}  Dispo: ***  Signed, Sammy Crisp, MD

## 2023-05-11 NOTE — Patient Instructions (Signed)
 Medication Instructions:  Your physician recommends the following medication changes.  STOP TAKING: Hydrochlorothiazide  Flecainide  (early June, 2025)  START TAKING: Lasix  10 mg by mouth daily as needed for swelling and or weight gain of 2 lbs overnight or 5 lbs in a week  *If you need a refill on your cardiac medications before your next appointment, please call your pharmacy*  Lab Work: No labs ordered today    Testing/Procedures: No test ordered today   Follow-Up: At [facility], you and your health needs are our priority.  As part of our continuing mission to provide you with exceptional heart care, our providers are all part of one team.  This team includes your primary Cardiologist (physician) and Advanced Practice Providers or APPs (Physician Assistants and Nurse Practitioners) who all work together to provide you with the care you need, when you need it.  Your next appointment:   End on June, 2025  Provider:   You may see Sammy Crisp, MD or one of the following Advanced Practice Providers on your designated Care Team:   Laneta Pintos, NP Gildardo Labrador, PA-C Varney Gentleman, PA-C Cadence Vermillion, PA-C Ronald Cockayne, NP Morey Ar, NP

## 2023-05-13 ENCOUNTER — Encounter: Payer: Self-pay | Admitting: Internal Medicine

## 2023-07-09 NOTE — Progress Notes (Unsigned)
 Cardiology Clinic Note   Date: 07/12/2023 ID: Priscilla Greene, DOB [birth year], MRN [id number]  Primary Cardiologist:  Lonni Hanson, MD  Chief Complaint   Priscilla Greene is a 58 y.o. female who presents to the clinic today for routine follow up.   Patient Profile   Priscilla Greene is followed by Dr. Hanson for the history outlined below.      Past medical history significant for: Dysautonomia/inappropriate sinus tachycardia. PVCs/NSVT. Tricuspid valve regurgitation. Echo 08/26/2022: EF 60 to 65%.  No RWMA.  Normal diastolic parameters.  Normal RV size/function.  Normal PA pressure, RVSP 31 mmHg.  Mild MR.  Mild to moderate TR. Hypercholesterolemia. TIA. DVT/PE. Thyroid  cancer. S/p thyroidectomy complicated by postoperative a-flutter, TIA, DVT/PE. Protein S deficiency. Syncope. Carotid duplex 08/26/2022: No evidence of thrombus, dissection, atherosclerotic plaque or stenosis in the cervical carotid system bilaterally.  Antegrade flow bilateral vertebral arteries.  Normal flow bilateral subclavian arteries. RA.  In summary, patient was previously seen by Dr. Fernande dating back to early 2000s for recurrent syncope.  She had been seen by Dr. Fernande remotely in the past also for syncope associated with nonsustained atrial arrhythmia rhythm which was clarified by the use of implantable loop recorder. There was a question if syncope was related to lupus but this was ruled out.  It was elected to treat her with flecainide .  She was maintained on flecainide  and Bystolic.  At her visit in February 2011 she reported to further episodes of syncope after getting up to use the bathroom in the middle of the night.  Dysautonomia was most suspected in the context of enteroenteric stimulation associated with being appropriately awakened and then loss of energy tone associated with bladder leak.  There was no evidence of recurrent atrial arrhythmia.  She has since been followed by [facility].  Patient establish care with Dr. Hanson on 10/21/2021.  She had previously been followed by Dr. Bosie.  She was doing well at that time and recovering from a COVID-19 infection.  She reported episodes of random nonexertional chest pain resolving with SL NTG.  Prior coronary CTA in myocardial perfusion stress test with no evidence of atherosclerotic CAD or ischemia/scar.  Palpitations controlled with nadolol .  She reported typically 3 syncopal episodes a year secondary to neurocardiogenic syncope.  She followed up in July 2024 and expressed concerns over syncopal episodes.  She also reported intermittent edema particularly with traveling and wanted a plan in place as she was going on a [place] cruise.  She had been prescribed HCTZ as needed edema but had never taken it.  She was encouraged to try the medication if she noted any swelling prior to leaving for her trip to see how she tolerated.  Echo was updated and showed normal LV/RV function, normal PA pressure, mild MR, mild to moderate TR.  Carotid ultrasound with no evidence of stenosis.  Patient was last seen in the office by Dr. Hanson on 05/11/2023 with several concerns.  She reported going on to cruises since her last visit in August and noted continued intermittent lower extremity edema.  She had been very reluctant to utilize HCTZ secondary to urinary frequency and diarrhea.  When she is holding onto extra fluid she experienced tightness across chest and elevated BP.  She reported BP reading 197/102 in the setting of a 13 pound weight gain.  She reported 2 syncopal episodes over the prior 6 months 1 while putting away Christmas decorations in her garage and the  other bending over while walking her dog.  Patient reported decision was made through [facility] to stop flecainide  but she was reluctant to do so.  It was recommended she stop flecainide  after planned trip to [place]  and [place]  later in the month.  Given side effects from  HCTZ patient was switched to Lasix  10 mg as needed for weight gain/edema.     History of Present Illness    Today, patient is concerned about stopping flecainide . She reports NSVT dating back to 2000 when she had a 5 beat run of NSVT and another longer 16 beat run. She worse several outpatient monitors and arrhythmia could not be captured. She then had an implantable loop recorder which captured the NSVT. This was also reproducible during EP study. Patient has been on flecainide  since that time. On 6/3 she stopped flecainide  completely as instructed by [facility] and had severe palpitations. She decided to wean off the medication instead. She began taking 1/2 tablet bid x 1 week, then once a day x 1 week, and every other day x 1 week. She has been completely off it for 9 days. She has had intermittent brief episodes of palpitations since that time with 2 prolonged episodes occurring in the evening. This is very concerning for her. Upon review of [facility] records, the decision to stop flecainide  occurred as patient had no arrhythmia during stress testing and flecainide  is not indicated for inappropriate sinus tachycardia. She is otherwise doing well. She has not needed to take Lasix , as she typically only has edema when she is on a cruise. Her BP is elevated today secondary to increased pain from a sciatica flare.     ROS: All other systems reviewed and are otherwise negative except as noted in History of Present Illness.  EKGs/Labs Reviewed    EKG Interpretation Date/Time:  Wednesday July 12 2023 10:13:45 EDT Ventricular Rate:  58 PR Interval:  132 QRS Duration:  92 QT Interval:  400 QTC Calculation: 392 R Axis:   10  Text Interpretation: Sinus bradycardia Minimal voltage criteria for LVH, may be normal variant ( R in aVL ) Cannot rule out Anterior infarct , age undetermined When compared with ECG of 11-May-2023 09:22, No significant change was found Confirmed by Loistine Sober  [phone]) on 07/12/2023 10:19:30 AM        Physical Exam    VS:  BP (!) 140/79 (BP Location: Left Arm, Patient Position: Sitting, Cuff Size: Normal)   Pulse (!) 58   Ht 5' 4 (1.626 m)   Wt 170 lb (77.1 kg)   SpO2 99%   BMI 29.18 kg/m  , BMI Body mass index is 29.18 kg/m.  GEN: Well nourished, well developed, in no acute distress. Neck: No JVD or carotid bruits. Cardiac:  RRR.  No murmur. No rubs or gallops.   Respiratory:  Respirations regular and unlabored. Clear to auscultation without rales, wheezing or rhonchi. GI: Soft, nontender, nondistended. Extremities: Radials/DP/PT 2+ and equal bilaterally. No clubbing or cyanosis. No edema.  Skin: Warm and dry, no rash. Neuro: Strength intact.  Assessment & Plan   Dysautonomia/inappropriate sinus tachycardia Manifested as inappropriate sinus tachycardia followed by dysautonomia clinic at [facility].  Patient denies recent syncope.  - Continue nadolol . - Continue to follow up with [facility].   Coronary calcifications Patient underwent CT chest in March at [facility] which showed coronary calcifications. Noncontrasted chest CT showed mild coronary artery calcium  in the left main and proximal left anterior descending  coronary arteries greater than expected for age. She denies chest pain, pressure or tightness. LDL 166 August 2024 followed by PCP.  - PCP can consider addition of statin.    PVCs/NSVT Diagnosed remotely in the setting of palpitations and NSVT noted on EP study and on loop recorder in 2000.  EP [facility] at [facility] recommended patient stop flecainide .  She reports since stopping flecainide  she has had more palpitations. Episodes have been brief with the exception of 2 nights when she had prolonged palpitations. She would like to know if she would be able to continue flecainide  as needed. EKG demonstrated sinus bradycardia 58 bpm. Discussed with Dr. Mady. Given increased palpitations and her history with Dr. Fernande he recommends EP  referral.  - Refer to EP for further evaluation.   Lower extremity edema/dyspnea Patient was intolerant of HCTZ secondary to urinary frequency and diarrhea.  Patient reports she only experiences lower extremity edema when she is on a cruise. She has not needed Lasix .  - Continue Lasix  as needed.  Disposition: Refer to EP. Return in 1 year or sooner as needed.          Signed, Barnie HERO. Daijanae Rafalski, DNP, NP-C

## 2023-07-12 ENCOUNTER — Ambulatory Visit: Payer: Self-pay | Attending: Student | Admitting: Student

## 2023-07-12 ENCOUNTER — Encounter: Payer: Self-pay | Admitting: Emergency Medicine

## 2023-07-12 ENCOUNTER — Other Ambulatory Visit: Payer: Self-pay | Admitting: Emergency Medicine

## 2023-07-12 ENCOUNTER — Encounter: Payer: Self-pay | Admitting: Student

## 2023-07-12 VITALS — BP 140/79 | HR 58 | Ht 64.0 in | Wt 170.0 lb

## 2023-07-12 DIAGNOSIS — G909 Disorder of the autonomic nervous system, unspecified: Secondary | ICD-10-CM

## 2023-07-12 DIAGNOSIS — I4729 Other ventricular tachycardia: Secondary | ICD-10-CM | POA: Diagnosis not present

## 2023-07-12 DIAGNOSIS — R002 Palpitations: Secondary | ICD-10-CM | POA: Diagnosis not present

## 2023-07-12 DIAGNOSIS — I2510 Atherosclerotic heart disease of native coronary artery without angina pectoris: Secondary | ICD-10-CM

## 2023-07-12 DIAGNOSIS — I493 Ventricular premature depolarization: Secondary | ICD-10-CM

## 2023-07-12 DIAGNOSIS — R600 Localized edema: Secondary | ICD-10-CM

## 2023-07-12 NOTE — Patient Instructions (Signed)
 Medication Instructions:  Your physician recommends that you continue on your current medications as directed. Please refer to the Current Medication list given to you today.   *If you need a refill on your cardiac medications before your next appointment, please call your pharmacy*  Lab Work: None ordered at this time  If you have labs (blood work) drawn today and your tests are completely normal, you will receive your results only by: MyChart Message (if you have MyChart) OR A paper copy in the mail If you have any lab test that is abnormal or we need to change your treatment, we will call you to review the results.  Testing/Procedures: None ordered at this time   Follow-Up: At [facility], you and your health needs are our priority.  As part of our continuing mission to provide you with exceptional heart care, our providers are all part of one team.  This team includes your primary Cardiologist (physician) and Advanced Practice Providers or APPs (Physician Assistants and Nurse Practitioners) who all work together to provide you with the care you need, when you need it.  Your next appointment:   1 year(s)  Provider:   Lonni Hanson, MD or Barnie Hila, NP    We recommend signing up for the patient portal called MyChart.  Sign up information is provided on this After Visit Summary.  MyChart is used to connect with patients for Virtual Visits (Telemedicine).  Patients are able to view lab/test results, encounter notes, upcoming appointments, etc.  Non-urgent messages can be sent to your provider as well.   To learn more about what you can do with MyChart, go to [email or web address].

## 2023-07-12 NOTE — Progress Notes (Signed)
 Rheumatology Follow Up Note  Chief Complaint  Patient presents with  . Rheumatoid arthritis of multiple sites with negative rheuma      Subjective:HPI  Priscilla Greene is a 58 y.o. female is here today for follow up of undifferentiates connective tissue disease, rheumatoid arthritis stage 1 mild. The patient's allergies, current medications, past family history, past medical history, past social history, past surgical history and problem list were reviewed and updated as appropriate.   She is taking the methotrexate and folic acid. She has no pain or swelling of the hand joints. She states that there is some ankle swelling. She is tolerating the medication well. She did have hair thinning that has improved. She does get raynaud's in cooler weather of the finger and toess.   She is having burning sensation of the right lower later leg. She also has pain of the right hip joint and right lateral knee. She denies any swelling of the knee. She also has right right heel pain. She has abnormal MRI of the lumbar spine in 2024. She is taking ibuprofen for pain control.   Review of Systems:   Review of Systems  Constitutional:  Negative for fatigue.  HENT:  Negative for mouth sores and trouble swallowing.        Dry Mouth  Eyes:  Negative for redness.       Dry Eyes  Respiratory:  Negative for cough and shortness of breath.   Cardiovascular:  Negative for chest pain and leg swelling.  Gastrointestinal:  Negative for constipation, diarrhea and nausea.  Endocrine: Negative for cold intolerance and heat intolerance.  Genitourinary:  Negative for hematuria.  Musculoskeletal:        Per HPI  Skin:  Positive for color change. Negative for rash.  Neurological:  Negative for dizziness, weakness, numbness and headaches.  Hematological:  Does not bruise/bleed easily.  Psychiatric/Behavioral:  Negative for dysphoric mood and sleep disturbance. The patient is not nervous/anxious.   All other systems  reviewed and are negative.  Objective:  Vitals:   07/12/23 1356  BP: (!) 152/92  Temp: 36.4 C (97.6 F)  TempSrc: Temporal  Weight: 75.6 kg (166 lb 10.7 oz)  Height: 162.6 cm (5' 4)  PainSc:   6    Length of Stiffness: Varies  GEN - Pleasant, No Apparent Distress  HEENT - normocephalic and atraumatic. Conjunctiva Clear.  No Nasal/Oral Ulcer. Mild Dry Mouth Neck - supple with no adenopathy or thyromegaly.   C spine with full range of motion. Heart - regular rate and rhythm, No murmurs/gallops/rub, Nml S1S2 Lungs - clear to auscultation in all fields. Extremities - there is no cyanosis or edema.  Neurological - alert and oriented.  Spine - no paraspinal tenderness; Right Lumbar spine tenderness Skin - no rashes observed MSK - The following joints were examined bilaterally: Hands, Wrists, Elbows, Shoulders, Metatarsals, Ankles, Knees and Hips; they were normal apart from what is noted.   100% Fist Formation Hypermobility of the thumbs PIP Joints and MTP with tenderness Right Knee with Crepitus, hyperextension, tenderness of the lateral aspect of the joint Right trochanteric bursa tenderness No Synovitis or Dactylitis 08 Tender Point  Labs/Imaging Reviewed in EMR Cr 0.6, Ast 13, ALT 12 CBC with WBC 4.5, Hgb 14.5, Hct 43.7, Plt 246 TSH 0.02 (L) CRP 0.19 ESR 08 Normal C3 and C4  Pos: ANA 1:160; Anti-CarP (27) Neg: ANA (Repeat), AntiCCP, HLAB27, RF, SSA, SSB, 14.3.3 ETA, AntiDNA, Anti-CEP-1,Anti-Sa Neg: Hep B and C  MRI Lumbar Spine (08/2022) 1. With regard to right side radiating pain, only borderline to mild right lateral recess stenosis is noted at L3-L4. Query right L4 radiculitis.  2. However, facet degeneration on the left at L5-S1 is associated with a 5-6 mm synovial cyst projecting into the left neural foramen. Query Left L5 radiculitis.  3. L4-L5 disc and facet degeneration without stenosis.   CT Chest ILD (03/2023) 1. No fibrosing interstitial lung disease.  Normal lungs. 2. Mild coronary artery calcium  that is greater than expected for age  Assessment and Plan   Undifferentiated connective tissue disease; Rheumatoid arthritis stage 1 mild: Mild -- Positive low titer ANA (Question drug-induced), positive Anti-CarP, Arthralgias, morning stiffness, sicca syndrome, noncaseating granuloma (resolved) -- Previous treatment with methotrexate and Enbrel; azathioprine (caused leukopenia) -- Off Plaquenil concern for palpitations, chest tightness   -- Can take prednisone  for flare as needed  -- Continue Ibuprofen as needed for pains -- Does use Biotene and keeps mouth moist for her dry mouth -- She does get dry eyes. She does use rewetting drops.  -- Continue methotrexate 10 mg weekly and Folic Acid 1 mg Daily   2. Long term use of immunosuppressive medication -- Methotrexate is an immunosuppressive medication that require drug toxicity monitoring.  -- Reviewed Labs  3. Lumbar Spondylosis with radicular pain -- Abnormal MRI, with radiculitis  -- Refer to Physiatry  -- Course of Prednisone    Diagnoses and all orders for this visit:  Rheumatoid arthritis of multiple sites with negative rheumatoid factor (CMS/HHS-HCC)  Undifferentiated connective tissue disease (CMS/HHS-HCC)  Long-term use of immunosuppressant medication  Lumbar spondylosis -     predniSONE  (DELTASONE ) 10 MG tablet; Take 4 tablets (40 mg total) by mouth once daily for 3 days, THEN 3 tablets (30 mg total) once daily for 3 days, THEN 2 tablets (20 mg total) once daily for 3 days, THEN 1 tablet (10 mg total) once daily for 3 days. -     Ambulatory Referral to Physiatrist      Return for Keep Follow Up .   All new prescription medications, changes in current prescription dosages, and sample medications were discussed with the patient, including patient education, medication name, use, dosage, potential side effects, drug interactions, consequences of not using/taking, and  special instructions.  Patient expressed understanding.  No barriers to adherence.   I appreciate the opportunity to participate in the care of Priscilla Greene. Please do not hesitate to contact me with any questions or concerns that may arise in regards to the patient's rheumatologic disease.    Attestation Statement:   I personally performed the service. (TP)  MAYUR LOREE BLANCH, MD

## 2023-07-12 NOTE — Progress Notes (Signed)
 Order placed for Referral to Electrophysiology for NSVT per Barnie Hila, NP.  Called the patient to inform her of the referral.

## 2023-09-19 ENCOUNTER — Ambulatory Visit: Admitting: Cardiology

## 2023-10-10 ENCOUNTER — Institutional Professional Consult (permissible substitution): Admitting: Cardiology

## 2023-10-26 ENCOUNTER — Emergency Department

## 2023-10-26 ENCOUNTER — Encounter: Payer: Self-pay | Admitting: Emergency Medicine

## 2023-10-26 ENCOUNTER — Other Ambulatory Visit: Payer: Self-pay

## 2023-10-26 ENCOUNTER — Encounter: Payer: Self-pay | Admitting: Student

## 2023-10-26 DIAGNOSIS — R1011 Right upper quadrant pain: Secondary | ICD-10-CM | POA: Diagnosis present

## 2023-10-26 DIAGNOSIS — K2900 Acute gastritis without bleeding: Secondary | ICD-10-CM | POA: Diagnosis not present

## 2023-10-26 LAB — URINALYSIS, ROUTINE W REFLEX MICROSCOPIC
Bilirubin Urine: NEGATIVE
Glucose, UA: NEGATIVE mg/dL
Hgb urine dipstick: NEGATIVE
Ketones, ur: NEGATIVE mg/dL
Leukocytes,Ua: NEGATIVE
Nitrite: NEGATIVE
Protein, ur: NEGATIVE mg/dL
Specific Gravity, Urine: 1.004 — ABNORMAL LOW (ref 1.005–1.030)
pH: 6 (ref 5.0–8.0)

## 2023-10-26 LAB — LIPASE, BLOOD: Lipase: 40 U/L (ref 11–51)

## 2023-10-26 LAB — COMPREHENSIVE METABOLIC PANEL WITH GFR
ALT: 19 U/L (ref 0–44)
AST: 25 U/L (ref 15–41)
Albumin: 4 g/dL (ref 3.5–5.0)
Alkaline Phosphatase: 66 U/L (ref 38–126)
Anion gap: 12 (ref 5–15)
BUN: 22 mg/dL — ABNORMAL HIGH (ref 6–20)
CO2: 25 mmol/L (ref 22–32)
Calcium: 8.8 mg/dL — ABNORMAL LOW (ref 8.9–10.3)
Chloride: 103 mmol/L (ref 98–111)
Creatinine, Ser: 0.52 mg/dL (ref 0.44–1.00)
GFR, Estimated: >60 mL/min (ref >60)
Glucose, Bld: 139 mg/dL — ABNORMAL HIGH (ref 70–99)
Potassium: 3.9 mmol/L (ref 3.5–5.1)
Sodium: 140 mmol/L (ref 135–145)
Total Bilirubin: 0.5 mg/dL (ref 0.0–1.2)
Total Protein: 6.7 g/dL (ref 6.5–8.1)

## 2023-10-26 LAB — CBC
HCT: 43.7 % (ref 36.0–46.0)
Hemoglobin: 14.4 g/dL (ref 12.0–15.0)
MCH: 30.7 pg (ref 26.0–34.0)
MCHC: 33 g/dL (ref 30.0–36.0)
MCV: 93.2 fL (ref 80.0–100.0)
Platelets: 247 K/uL (ref 150–400)
RBC: 4.69 MIL/uL (ref 3.87–5.11)
RDW: 13.2 % (ref 11.5–15.5)
WBC: 4.5 K/uL (ref 4.0–10.5)
nRBC: 0 % (ref 0.0–0.2)

## 2023-10-26 LAB — TROPONIN I (HIGH SENSITIVITY): Troponin I (High Sensitivity): 3 ng/L (ref <18)

## 2023-10-26 NOTE — ED Triage Notes (Addendum)
 Pt arrives POV, ambulatory to triage, gait steady, no acute distress noted c/o epigastric pain since Sunday. Pt states today her pain is now in RUQ that radiates to shoulder blades w/ some sob and nausea. Pt also reports when she urinated today there was a small amount of blood when she wiped, pt states she does not have uterus so unsure where blood is coming from. Pt had two episodes of diarrhea today.

## 2023-10-27 ENCOUNTER — Emergency Department

## 2023-10-27 ENCOUNTER — Emergency Department
Admission: EM | Admit: 2023-10-27 | Discharge: 2023-10-27 | Disposition: A | Discharge status: Home / Self Care | Attending: Emergency Medicine | Admitting: Emergency Medicine

## 2023-10-27 DIAGNOSIS — K2900 Acute gastritis without bleeding: Secondary | ICD-10-CM

## 2023-10-27 LAB — TROPONIN I (HIGH SENSITIVITY): Troponin I (High Sensitivity): 2 ng/L (ref <18)

## 2023-10-27 MED ORDER — PANTOPRAZOLE SODIUM 40 MG PO TBEC: 40.0000 mg | DELAYED_RELEASE_TABLET | Freq: Every day | ORAL | 0 refills | Status: AC

## 2023-10-27 MED ORDER — ONDANSETRON 4 MG PO TBDP: 4.0000 mg | ORAL_TABLET | Freq: Three times a day (TID) | ORAL | 0 refills | Status: AC | PRN

## 2023-10-27 MED ORDER — SUCRALFATE 1 G PO TABS: 1.0000 g | ORAL_TABLET | Freq: Three times a day (TID) | ORAL | 0 refills | Status: AC

## 2023-10-27 NOTE — Discharge Instructions (Addendum)
 Return to the ER for black stools, fevers, worsening pain or any other concerns.  We are treating you for possible inflammation of the stomach but could be gastritis versus ulcer.

## 2023-10-27 NOTE — ED Provider Notes (Signed)
 [facility] Provider Note    Event Date/Time   First MD Initiated Contact with Patient 10/27/23 0251     (approximate)   History   Abdominal Pain and Nausea   HPI  Priscilla Greene is a 58 y.o. female who comes in with upper abdominal pain.  Patient has a history of dysautonomia, rheumatoid arthritis on methotrexate who comes in with upper abdominal pain.  She has had about a week of upper abdominal pain with feeling very full  after eating.  Occasional nausea.  Did take ibuprofen and that seemed to help the pain.  She denies any pain in her chest or shortness of breath or pain with breathing.  Physical Exam   Triage Vital Signs: ED Triage Vitals  Encounter Vitals Group     BP 10/26/23 2135 (!) 170/93     Girls Systolic BP Percentile --      Girls Diastolic BP Percentile --      Boys Systolic BP Percentile --      Boys Diastolic BP Percentile --      Pulse Rate 10/26/23 2135 81     Resp 10/26/23 2135 18     Temp 10/26/23 2135 99.6 F (37.6 C)     Temp Source 10/26/23 2135 Oral     SpO2 10/26/23 2135 97 %     Weight 10/26/23 2137 68 lb 12.8 oz (31.2 kg)     Height 10/26/23 2137 5' 4 (1.626 m)     Head Circumference --      Peak Flow --      Pain Score 10/26/23 2136 4     Pain Loc --      Pain Education --      Exclude from Growth Chart --     Most recent vital signs: Vitals:   10/26/23 2135 10/27/23 0316  BP: (!) 170/93 (!) 147/92  Pulse: 81 72  Resp: 18   Temp: 99.6 F (37.6 C) 98.6 F (37 C)  SpO2: 97% 100%     General: Awake, no distress.  CV:  Good peripheral perfusion.  Resp:  Normal effort.  Abd:  No distention.  Tenderness in the right upper abdomen, mid abdomen.  No rebound, no guarding Other:  No new swelling in legs.  No calf tenderness   ED Results / Procedures / Treatments   Labs (all labs ordered are listed, but only abnormal results are displayed) Labs Reviewed  COMPREHENSIVE METABOLIC PANEL WITH GFR -  Abnormal; Notable for the following components:      Result Value   Glucose, Bld 139 (*)    BUN 22 (*)    Calcium  8.8 (*)    All other components within normal limits  URINALYSIS, ROUTINE W REFLEX MICROSCOPIC - Abnormal; Notable for the following components:   Color, Urine STRAW (*)    APPearance CLEAR (*)    Specific Gravity, Urine 1.004 (*)    All other components within normal limits  LIPASE, BLOOD  CBC  TROPONIN I (HIGH SENSITIVITY)  TROPONIN I (HIGH SENSITIVITY)     EKG  My interpretation of EKG:  Normal sinus rhythm 75 without any ST elevation, T wave inversion in lead III, normal intervals.  Similar T wave inversion in 2024  RADIOLOGY I have reviewed the xray personally and interpreted no evidence of any pneumonia   PROCEDURES:  Critical Care performed: No  .1-3 Lead EKG Interpretation  Performed by: Ernest Ronal BRAVO, MD Authorized by: Ernest Ronal BRAVO,  MD     Interpretation: normal     ECG rate:  60   ECG rate assessment: normal     Rhythm: sinus rhythm     Ectopy: none     Conduction: normal      MEDICATIONS ORDERED IN ED: Medications - No data to display   IMPRESSION / MDM / ASSESSMENT AND PLAN / ED COURSE  I reviewed the triage vital signs and the nursing notes.   Patient's presentation is most consistent with acute presentation with potential threat to life or bodily function.   Patient comes in with epigastric pain and a little bit of right upper quadrant.  Workup was done to evaluate for ACS, ultrasound evaluate for gallstones.  Patient really denied any shortness of breath it is more of just pain in her upper abdomen.  She has got no hypoxia no tachycardia no evidence of DVT on examination  Lipase normal troponins are negative x 2 CMP reassuring CBC reassuring  Ultrasound was negative except for some hepatic steatosis which I did discuss with patient.  Discussed with patient we will get CT imaging she is allergic to contrast therefore we will  proceed with CT without.   CT imaging was reassuring.  Given patient's upper abdominal pain we discussed the possibility of gastritis, ulcers.  Will start her on acid reducers, Carafate we discussed the dangers of ibuprofen and discussed following up with GI.  She does report having a GI doctor Dr. Aundria that she will call to make an appointment she has a history of needing esophageal stretching.  She felt comfortable with discharge home declined p.o. challenge and preferred to be discharged.  There was some question of her having some blood but unclear where it was coming from.  Did confirm with patient it was not any black tarry stools and her hemoglobin was normal but we did discuss returning for this.   The patient is on the cardiac monitor to evaluate for evidence of arrhythmia and/or significant heart rate changes.      FINAL CLINICAL IMPRESSION(S) / ED DIAGNOSES   Final diagnoses:  Acute gastritis without hemorrhage, unspecified gastritis type     Rx / DC Orders   ED Discharge Orders     None        Note:  This document was prepared using Dragon voice recognition software and may include unintentional dictation errors.   Ernest Ronal BRAVO, MD 10/27/23 [phone]

## 2023-11-13 ENCOUNTER — Other Ambulatory Visit: Payer: Self-pay | Admitting: Internal Medicine

## 2023-11-13 DIAGNOSIS — K828 Other specified diseases of gallbladder: Secondary | ICD-10-CM

## 2023-11-17 ENCOUNTER — Ambulatory Visit
Admission: RE | Admit: 2023-11-17 | Discharge: 2023-11-17 | Disposition: A | Discharge status: Home / Self Care | Source: Ambulatory Visit | Attending: Internal Medicine | Admitting: Internal Medicine

## 2023-11-17 DIAGNOSIS — K828 Other specified diseases of gallbladder: Secondary | ICD-10-CM | POA: Insufficient documentation

## 2023-11-17 MED ORDER — TECHNETIUM TC 99M MEBROFENIN IV KIT
5.0000 | PACK | Freq: Once | INTRAVENOUS | Status: AC | PRN
  Administered 2023-11-17: 5.41 via INTRAVENOUS

## 2023-11-21 ENCOUNTER — Other Ambulatory Visit

## 2023-12-18 ENCOUNTER — Ambulatory Visit: Payer: Self-pay

## 2023-12-18 DIAGNOSIS — K219 Gastro-esophageal reflux disease without esophagitis: Secondary | ICD-10-CM | POA: Diagnosis not present

## 2023-12-18 DIAGNOSIS — K30 Functional dyspepsia: Secondary | ICD-10-CM | POA: Diagnosis not present

## 2023-12-18 DIAGNOSIS — R1313 Dysphagia, pharyngeal phase: Secondary | ICD-10-CM | POA: Diagnosis not present

## 2023-12-18 DIAGNOSIS — R1319 Other dysphagia: Secondary | ICD-10-CM | POA: Diagnosis not present
